# Patient Record
Sex: Female | Born: 1940 | Race: White | Hispanic: No | Marital: Married | Smoking: Never smoker
Health system: Southern US, Community
[De-identification: ages and names within clinical notes are randomized; demographics above are authoritative.]

## PROBLEM LIST (undated history)

## (undated) DIAGNOSIS — R7303 Prediabetes: Secondary | ICD-10-CM

## (undated) DIAGNOSIS — E785 Hyperlipidemia, unspecified: Secondary | ICD-10-CM

## (undated) DIAGNOSIS — Z8489 Family history of other specified conditions: Secondary | ICD-10-CM

## (undated) DIAGNOSIS — I739 Peripheral vascular disease, unspecified: Secondary | ICD-10-CM

## (undated) DIAGNOSIS — M869 Osteomyelitis, unspecified: Secondary | ICD-10-CM

## (undated) HISTORY — PX: BONE RESECTION: SHX897

## (undated) HISTORY — DX: Osteomyelitis, unspecified: M86.9

## (undated) HISTORY — DX: Peripheral vascular disease, unspecified: I73.9

## (undated) HISTORY — DX: Hyperlipidemia, unspecified: E78.5

---

## 1970-04-29 HISTORY — PX: BREAST BIOPSY: SHX20

## 2004-07-16 ENCOUNTER — Ambulatory Visit: Payer: Self-pay | Admitting: Internal Medicine

## 2005-08-15 ENCOUNTER — Ambulatory Visit: Payer: Self-pay | Admitting: Internal Medicine

## 2005-11-18 ENCOUNTER — Ambulatory Visit: Payer: Self-pay | Admitting: Gastroenterology

## 2006-08-25 ENCOUNTER — Ambulatory Visit: Payer: Self-pay

## 2007-08-17 ENCOUNTER — Ambulatory Visit: Payer: Self-pay | Admitting: Family Medicine

## 2008-08-18 ENCOUNTER — Ambulatory Visit: Payer: Self-pay | Admitting: Nurse Practitioner

## 2009-10-26 ENCOUNTER — Ambulatory Visit: Payer: Self-pay | Admitting: Nurse Practitioner

## 2010-09-16 ENCOUNTER — Ambulatory Visit: Payer: Self-pay | Admitting: Internal Medicine

## 2010-11-08 ENCOUNTER — Ambulatory Visit: Payer: Self-pay | Admitting: Family Medicine

## 2010-11-29 ENCOUNTER — Ambulatory Visit: Payer: Self-pay | Admitting: Family Medicine

## 2011-06-27 ENCOUNTER — Ambulatory Visit: Payer: Self-pay | Admitting: Family Medicine

## 2012-12-02 ENCOUNTER — Ambulatory Visit: Payer: Self-pay | Admitting: Family Medicine

## 2014-01-27 ENCOUNTER — Ambulatory Visit: Payer: Self-pay | Admitting: Family Medicine

## 2014-04-09 ENCOUNTER — Ambulatory Visit: Payer: Self-pay | Admitting: Physician Assistant

## 2015-01-16 ENCOUNTER — Other Ambulatory Visit: Payer: Self-pay | Admitting: Family Medicine

## 2015-01-16 DIAGNOSIS — Z1231 Encounter for screening mammogram for malignant neoplasm of breast: Secondary | ICD-10-CM

## 2015-02-01 ENCOUNTER — Ambulatory Visit
Admission: RE | Admit: 2015-02-01 | Discharge: 2015-02-01 | Disposition: A | Discharge status: Home / Self Care | Payer: Medicare Other | Source: Ambulatory Visit | Attending: Family Medicine | Admitting: Family Medicine

## 2015-02-01 DIAGNOSIS — Z1231 Encounter for screening mammogram for malignant neoplasm of breast: Secondary | ICD-10-CM | POA: Diagnosis not present

## 2016-01-08 ENCOUNTER — Other Ambulatory Visit: Payer: Self-pay | Admitting: Family Medicine

## 2016-01-08 DIAGNOSIS — Z1231 Encounter for screening mammogram for malignant neoplasm of breast: Secondary | ICD-10-CM

## 2016-02-08 ENCOUNTER — Ambulatory Visit
Admission: RE | Admit: 2016-02-08 | Discharge: 2016-02-08 | Disposition: A | Discharge status: Home / Self Care | Payer: Medicare Other | Source: Ambulatory Visit | Attending: Family Medicine | Admitting: Family Medicine

## 2016-02-08 DIAGNOSIS — Z1231 Encounter for screening mammogram for malignant neoplasm of breast: Secondary | ICD-10-CM | POA: Insufficient documentation

## 2016-02-29 ENCOUNTER — Encounter (INDEPENDENT_AMBULATORY_CARE_PROVIDER_SITE_OTHER): Payer: Self-pay | Admitting: Vascular Surgery

## 2016-03-07 ENCOUNTER — Ambulatory Visit (INDEPENDENT_AMBULATORY_CARE_PROVIDER_SITE_OTHER): Payer: Medicare Other | Admitting: Vascular Surgery

## 2016-03-07 ENCOUNTER — Encounter (INDEPENDENT_AMBULATORY_CARE_PROVIDER_SITE_OTHER): Payer: Self-pay | Admitting: Vascular Surgery

## 2016-03-07 ENCOUNTER — Encounter (INDEPENDENT_AMBULATORY_CARE_PROVIDER_SITE_OTHER): Payer: Self-pay

## 2016-03-07 DIAGNOSIS — M79609 Pain in unspecified limb: Secondary | ICD-10-CM | POA: Insufficient documentation

## 2016-03-07 DIAGNOSIS — M79604 Pain in right leg: Secondary | ICD-10-CM | POA: Diagnosis not present

## 2016-03-07 DIAGNOSIS — I872 Venous insufficiency (chronic) (peripheral): Secondary | ICD-10-CM

## 2016-03-07 DIAGNOSIS — G4762 Sleep related leg cramps: Secondary | ICD-10-CM | POA: Diagnosis not present

## 2016-03-07 DIAGNOSIS — I83811 Varicose veins of right lower extremities with pain: Secondary | ICD-10-CM

## 2016-03-07 NOTE — Progress Notes (Signed)
Patient in room #2 

## 2016-03-07 NOTE — Progress Notes (Signed)
[facility] Admission History & Physical  MRN : [id number]  Kelly Rodgers is a 75 y.o. (05/21/1940) female who presents with chief complaint of  Chief Complaint  Patient presents with  . New Evaluation    Inflammed varicose veins  .  History of Present Illness:  Recommend:  The patient has large symptomatic varicose veins that are painful and associated with swelling.  I have had a long discussion with the patient regarding  varicose veins and why they cause symptoms.  Patient will begin wearing graduated compression stockings class 1 on a daily basis, beginning first thing in the morning and removing them in the evening. The patient is instructed specifically not to sleep in the stockings.    The patient  will also begin using over-the-counter analgesics such as Motrin 600 mg po TID to help control the symptoms.    In addition, behavioral modification including elevation during the day will be initiated.    Pending the results of these changes the  patient will be reevaluated in three months.   An  ultrasound of the venous system will be obtained.   Further plans will be based on the ultrasound results and whether conservative therapies are successful at eliminating the pain and swelling.   Current Meds  Medication Sig  . zolpidem (AMBIEN) 5 MG tablet Take by mouth.    Past Medical History:  Diagnosis Date  . Hyperlipidemia   . Osteomyelitis (HCC)   . Peripheral vascular disease [facility](HCC)     Past Surgical History:  Procedure Laterality Date  . BONE RESECTION     Reclast infusion  . BREAST BIOPSY Right 1972   neg    Social History Social History  Substance Use Topics  . Smoking status: Never Smoker  . Smokeless tobacco: Never Used  . Alcohol use No    Family History Family History  Problem Relation Age of Onset  . Diabetes Mother   . Heart disease Mother   . Varicose Veins Mother   . Cancer Father   . Heart disease Father   . Heart  disease Sister   . Depression Sister   . Heart disease Brother   . Depression Brother   . Diabetes Maternal Grandmother   . Breast cancer Neg Hx   No family history of bleeding/clotting disorders, porphyria or autoimmune disease   Allergies not on file   REVIEW OF SYSTEMS (Negative unless checked)  Constitutional: [] Weight loss  [] Fever  [] Chills Cardiac: [] Chest pain   [] Chest pressure   [] Palpitations   [] Shortness of breath when laying flat   [] Shortness of breath with exertion. Vascular:  [] Pain in legs with walking   [x] Pain in legs at rest  [] History of DVT   [] Phlebitis   [x] Swelling in legs   [x] Varicose veins   [] Non-healing ulcers Pulmonary:   [] Uses home oxygen   [] Productive cough   [] Hemoptysis   [] Wheeze  [] COPD   [] Asthma Neurologic:  [] Dizziness   [] Seizures   [] History of stroke   [] History of TIA  [] Aphasia   [] Vissual changes   [] Weakness or numbness in arm   [] Weakness or numbness in leg Musculoskeletal:   [] Joint swelling   [x] Joint pain   [] Low back pain Hematologic:  [] Easy bruising  [] Easy bleeding   [] Hypercoagulable state   [] Anemic Gastrointestinal:  [] Diarrhea   [] Vomiting  [] Gastroesophageal reflux/heartburn   [] Difficulty swallowing. Genitourinary:  [] Chronic kidney disease   [] Difficult urination  [] Frequent urination   [] Blood in  urine Skin:  [] Rashes   [] Ulcers  Psychological:  [] History of anxiety   []  History of major depression.  Physical Examination  Vitals:   03/07/16 1329  BP: 128/74  Pulse: 89  Resp: 16  Weight: 113 lb (51.3 kg)  Height: 5\' 4"  (1.626 m)   Body mass index is 19.4 kg/m. Gen: WD/WN, NAD Head: [place]/AT, No temporalis wasting.  Ear/Nose/Throat: Hearing grossly intact, nares w/o erythema or drainage, poor dentition Eyes: PER, EOMI, sclera nonicteric.  Neck: Supple, no masses.  No bruit or JVD.  Pulmonary:  Good air movement, clear to auscultation bilaterally, no use of accessory muscles.  Cardiac: RRR, normal S1, S2, no  Murmurs. Vascular: diffuse varicose veins bilaterally, mild venous changes to the ankles, 2+ edema bilaterally Vessel Right Left  Radial Palpable Palpable  Ulnar Palpable Palpable  Brachial Palpable Palpable  Carotid Palpable Palpable  Femoral Palpable Palpable  Popliteal Palpable Palpable  PT 1+ Palpable 1+Palpable  DP Trace Palpable Trace Palpable   Gastrointestinal: soft, non-distended. No guarding/no peritoneal signs.  Musculoskeletal: M/S 5/5 throughout.  No deformity or atrophy.  Neurologic: CN 2-12 intact. Pain and light touch intact in extremities.  Symmetrical.  Speech is fluent. Motor exam as listed above. Psychiatric: Judgment intact, Mood & affect appropriate for pt's clinical situation. Dermatologic: No rashes or ulcers noted.  No changes consistent with cellulitis. Lymph : No Cervical lymphadenopathy, no lichenification or skin changes of chronic lymphedema.  CBC No results found for: WBC, HGB, HCT, MCV, PLT  BMET No results found for: NA, K, CL, CO2, GLUCOSE, BUN, CREATININE, CALCIUM, GFRNONAA, GFRAA CrCl cannot be calculated (No order found.).  COAG No results found for: INR, PROTIME  Radiology Mm Screening Breast Tomo Bilateral  Result Date: 02/08/2016 CLINICAL DATA:  Screening. EXAM: 2D DIGITAL SCREENING BILATERAL MAMMOGRAM WITH CAD AND ADJUNCT TOMO COMPARISON:  Previous exam(s). ACR Breast Density Category c: The breast tissue is heterogeneously dense, which may obscure small masses. FINDINGS: There are no findings suspicious for malignancy. Images were processed with CAD. IMPRESSION: No mammographic evidence of malignancy. A result letter of this screening mammogram will be mailed directly to the patient. RECOMMENDATION: Screening mammogram in one year. (Code:SM-B-01Y) BI-RADS CATEGORY  1: Negative. Electronically Signed   By: Sherian Rein M.D.   On: 02/08/2016 14:07    Assessment/Plan 1. 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  I have had a long discussion with the patient regarding  varicose veins and why they cause symptoms.  Patient will begin wearing graduated compression stockings class 1 on a daily basis, beginning first thing in the morning and removing them in the evening. The patient is instructed specifically not to sleep in the stockings.    The patient  will also begin using over-the-counter analgesics such as Motrin 600 mg po TID to help control the symptoms.    In addition, behavioral modification including elevation during the day will be initiated.    Pending the results of these changes the  patient will be reevaluated in three months.   An  ultrasound of the venous system will be obtained.   Further plans will be based on the ultrasound results and whether conservative therapies are successful at eliminating the pain and swelling.  - VAS [place] LOWER EXTREMITY VENOUS REFLUX; Future  2. Chronic venous insufficiency  Recommend:  The patient has large symptomatic varicose veins that are painful  and associated with swelling.  I have had a long discussion with the patient regarding  varicose veins and why they cause symptoms.  Patient will begin wearing graduated compression stockings class 1 on a daily basis, beginning first thing in the morning and removing them in the evening. The patient is instructed specifically not to sleep in the stockings.    The patient  will also begin using over-the-counter analgesics such as Motrin 600 mg po TID to help control the symptoms.    In addition, behavioral modification including elevation during the day will be initiated.    Pending the results of these changes the  patient will be reevaluated in three months.   An  ultrasound of the venous system will be obtained.   Further plans will be based on the ultrasound results and whether conservative therapies are successful at  eliminating the pain and swelling.   3. Leg cramps, sleep related Recommend:  The patient is describing Charley horse type leg cramps. No invasive studies, angiography or surgery at this time.    I have reviewed homeopathic remedies such as Cider vinegar or mustard; placing a bar of soap at the bottom of the bed. Quinine is also an option Magnesium supplementation at bedtime was also reviewed.  The patient should continue walking and begin a more formal exercise program.  The patient should continue antiplatelet therapy and aggressive treatment of the lipid abnormalities  The patient should continue wearing graduated compression socks 10-15 mmHg strength to control any mild edema.  The patient will follow up with me on a PRN basis.   4. Pain of right lower extremity See # 1 and # 3    Levora DredgeGregory Meiah Zamudio, MD  03/07/2016 2:25 PM

## 2016-06-06 ENCOUNTER — Ambulatory Visit (INDEPENDENT_AMBULATORY_CARE_PROVIDER_SITE_OTHER): Payer: Medicare Other | Admitting: Vascular Surgery

## 2016-06-06 ENCOUNTER — Encounter (INDEPENDENT_AMBULATORY_CARE_PROVIDER_SITE_OTHER): Payer: Medicare Other

## 2016-06-28 ENCOUNTER — Ambulatory Visit
Admission: RE | Admit: 2016-06-28 | Payer: Medicare Other | Source: Ambulatory Visit | Admitting: Unknown Physician Specialty

## 2016-06-28 ENCOUNTER — Encounter: Admission: RE | Payer: Self-pay | Source: Ambulatory Visit

## 2016-06-28 SURGERY — COLONOSCOPY WITH PROPOFOL
Anesthesia: General

## 2016-06-29 ENCOUNTER — Ambulatory Visit
Admission: EM | Admit: 2016-06-29 | Discharge: 2016-06-29 | Disposition: A | Discharge status: Home / Self Care | Payer: Medicare Other | Attending: Family Medicine | Admitting: Family Medicine

## 2016-06-29 DIAGNOSIS — E785 Hyperlipidemia, unspecified: Secondary | ICD-10-CM | POA: Insufficient documentation

## 2016-06-29 DIAGNOSIS — R350 Frequency of micturition: Secondary | ICD-10-CM

## 2016-06-29 DIAGNOSIS — Z79899 Other long term (current) drug therapy: Secondary | ICD-10-CM | POA: Diagnosis not present

## 2016-06-29 DIAGNOSIS — R3915 Urgency of urination: Secondary | ICD-10-CM | POA: Insufficient documentation

## 2016-06-29 DIAGNOSIS — R300 Dysuria: Secondary | ICD-10-CM | POA: Diagnosis present

## 2016-06-29 LAB — URINALYSIS, COMPLETE (UACMP) WITH MICROSCOPIC
BACTERIA UA: NONE SEEN
RBC / HPF: NONE SEEN RBC/hpf (ref 0–5)
Squamous Epithelial / LPF: NONE SEEN

## 2016-06-29 MED ORDER — CIPROFLOXACIN HCL 500 MG PO TABS: 500.0000 mg | ORAL_TABLET | Freq: Two times a day (BID) | ORAL | 0 refills | Status: AC

## 2016-06-29 NOTE — Discharge Instructions (Signed)
Recommend start Cipro 500mg  twice a day as directed. May continue AZO as needed. Continue to increase fluids. Follow-up pending urine culture results and with your Urologist in 3 to 4 days if not improving.

## 2016-06-29 NOTE — ED Provider Notes (Signed)
CSN: [id number]     Arrival date & time 06/29/16  1004 History   First MD Initiated Contact with Patient 06/29/16 1042     Chief Complaint  Patient presents with  . Recurrent UTI   (Consider location/radiation/quality/duration/timing/severity/associated sxs/prior Treatment) 76 year old female presents with burning with urination, frequency, urgency and foul odor to her urine for the past 2 days. Denies any fever, abdominal pain, nausea, vomiting or flank pain. She took AZO today with some relief. Has history of recurrent UTI's. Last one about 4 weeks ago and took Macrobid with some success. Previously on Cipro with good success. Has not seen Urologist a few years but plans to return for recheck within the next month.    The history is provided by the patient.    Past Medical History:  Diagnosis Date  . Hyperlipidemia   . Osteomyelitis (HCC)   . Peripheral vascular disease [facility](HCC)    Past Surgical History:  Procedure Laterality Date  . BONE RESECTION     Reclast infusion  . BREAST BIOPSY Right 1972   neg   Family History  Problem Relation Age of Onset  . Diabetes Mother   . Heart disease Mother   . Varicose Veins Mother   . Cancer Father   . Heart disease Father   . Heart disease Sister   . Depression Sister   . Heart disease Brother   . Depression Brother   . Diabetes Maternal Grandmother   . Breast cancer Neg Hx    Social History  Substance Use Topics  . Smoking status: Never Smoker  . Smokeless tobacco: Never Used  . Alcohol use No   OB History    No data available     Review of Systems  Constitutional: Negative for chills, fatigue and fever.  Gastrointestinal: Negative for abdominal pain, diarrhea, nausea and vomiting.  Genitourinary: Positive for decreased urine volume, dysuria, frequency and urgency. Negative for hematuria and pelvic pain.  Musculoskeletal: Positive for back pain.  Skin: Negative for rash and wound.  Neurological: Negative for dizziness,  syncope, weakness and headaches.  Hematological: Negative for adenopathy.    Allergies  Patient has no known allergies.  Home Medications   Prior to Admission medications   Medication Sig Start Date End Date Taking? Authorizing Provider  Calcium Carbonate-Vitamin D (CALCIUM-VITAMIN D) 500-200 MG-UNIT tablet Take by mouth.   Yes Historical Provider, MD  clonazePAM (KLONOPIN) 0.5 MG tablet  02/01/16  Yes Historical Provider, MD  gabapentin (NEURONTIN) 600 MG tablet Take 600 mg by mouth at bedtime as needed.   Yes Historical Provider, MD  phenazopyridine (PYRIDIUM) 95 MG tablet Take 95 mg by mouth daily. Pt used 2 of 95 mg tablet this morning   Yes Historical Provider, MD  zolpidem (AMBIEN) 5 MG tablet Take by mouth. 07/21/15 07/21/16 Yes Historical Provider, MD  ciprofloxacin (CIPRO) 500 MG tablet Take 1 tablet (500 mg total) by mouth 2 (two) times daily. 06/29/16 07/06/16  Sudie GrumblingAnn Berry Kenroy Timberman, NP   Meds Ordered and Administered this Visit  Medications - No data to display  BP (!) 133/56 (BP Location: Left Arm)   Pulse 74   Temp 97.8 F (36.6 C) (Oral)   Resp 16   Ht 5' 4.5" (1.638 m)   Wt 112 lb (50.8 kg)   SpO2 100%   BMI 18.93 kg/m  No data found.   Physical Exam  Constitutional: She is oriented to person, place, and time. She appears well-developed and well-nourished. No distress.  Cardiovascular: Normal rate and regular rhythm.   Abdominal: Soft. Bowel sounds are normal. There is no hepatosplenomegaly. There is no tenderness. There is no rigidity, no rebound, no guarding and no CVA tenderness.  Musculoskeletal: Normal range of motion.  Neurological: She is alert and oriented to person, place, and time.  Skin: Skin is warm and dry. Capillary refill takes less than 2 seconds.  Psychiatric: She has a normal mood and affect. Her behavior is normal. Judgment and thought content normal.    Urgent Care Course     Procedures (including critical care time)  Labs Review Labs Reviewed   URINALYSIS, COMPLETE (UACMP) WITH MICROSCOPIC - Abnormal; Notable for the following:       Result Value   Color, Urine ORANGE (*)    APPearance HAZY (*)    Glucose, UA   (*)    Value: TEST NOT REPORTED DUE TO COLOR INTERFERENCE OF URINE PIGMENT   Hgb urine dipstick   (*)    Value: TEST NOT REPORTED DUE TO COLOR INTERFERENCE OF URINE PIGMENT   Bilirubin Urine   (*)    Value: TEST NOT REPORTED DUE TO COLOR INTERFERENCE OF URINE PIGMENT   Ketones, ur   (*)    Value: TEST NOT REPORTED DUE TO COLOR INTERFERENCE OF URINE PIGMENT   Protein, ur   (*)    Value: TEST NOT REPORTED DUE TO COLOR INTERFERENCE OF URINE PIGMENT   Nitrite   (*)    Value: TEST NOT REPORTED DUE TO COLOR INTERFERENCE OF URINE PIGMENT   Leukocytes, UA   (*)    Value: TEST NOT REPORTED DUE TO COLOR INTERFERENCE OF URINE PIGMENT   All other components within normal limits  URINE CULTURE    Imaging Review No results found.   Visual Acuity Review  Right Eye Distance:   Left Eye Distance:   Bilateral Distance:    Right Eye Near:   Left Eye Near:    Bilateral Near:         MDM   1. Dysuria   2. Urinary frequency    Discussed that AZO interfered with interpretation of urinalysis results. Will treat for a UTI and send urine culture today. Since patient has had good success with Cipro in the past, will treat with Cipro 500mg  twice a day as directed. May continue AZO as needed. Continue to increase fluids to help with symptoms. Follow-up pending urine culture results and will her Urologist in [place] in 3 to 4 days if not improving.     Sudie Grumbling, NP 06/29/16 1501

## 2016-06-29 NOTE — ED Triage Notes (Signed)
As per patient burning foul smell frequent urination onset 2 days

## 2016-07-01 LAB — URINE CULTURE

## 2017-01-03 ENCOUNTER — Encounter: Admission: RE | Payer: Self-pay | Source: Ambulatory Visit

## 2017-01-03 ENCOUNTER — Ambulatory Visit
Admission: RE | Admit: 2017-01-03 | Payer: Medicare Other | Source: Ambulatory Visit | Admitting: Unknown Physician Specialty

## 2017-01-03 SURGERY — COLONOSCOPY WITH PROPOFOL
Anesthesia: General

## 2017-01-31 ENCOUNTER — Other Ambulatory Visit: Payer: Self-pay | Admitting: Family Medicine

## 2017-01-31 DIAGNOSIS — Z1231 Encounter for screening mammogram for malignant neoplasm of breast: Secondary | ICD-10-CM

## 2017-02-26 ENCOUNTER — Ambulatory Visit
Admission: RE | Admit: 2017-02-26 | Discharge: 2017-02-26 | Disposition: A | Discharge status: Home / Self Care | Payer: Medicare Other | Source: Ambulatory Visit | Attending: Family Medicine | Admitting: Family Medicine

## 2017-02-26 DIAGNOSIS — Z1231 Encounter for screening mammogram for malignant neoplasm of breast: Secondary | ICD-10-CM | POA: Insufficient documentation

## 2018-04-03 ENCOUNTER — Other Ambulatory Visit: Payer: Self-pay | Admitting: Family Medicine

## 2018-04-03 DIAGNOSIS — Z1231 Encounter for screening mammogram for malignant neoplasm of breast: Secondary | ICD-10-CM

## 2018-04-08 ENCOUNTER — Ambulatory Visit
Admission: RE | Admit: 2018-04-08 | Discharge: 2018-04-08 | Disposition: A | Discharge status: Home / Self Care | Payer: Medicare Other | Source: Ambulatory Visit | Attending: Family Medicine | Admitting: Family Medicine

## 2018-04-08 ENCOUNTER — Encounter (INDEPENDENT_AMBULATORY_CARE_PROVIDER_SITE_OTHER): Payer: Self-pay

## 2018-04-08 DIAGNOSIS — Z1231 Encounter for screening mammogram for malignant neoplasm of breast: Secondary | ICD-10-CM | POA: Insufficient documentation

## 2018-06-29 ENCOUNTER — Other Ambulatory Visit: Payer: Self-pay

## 2018-06-29 ENCOUNTER — Ambulatory Visit
Admission: EM | Admit: 2018-06-29 | Discharge: 2018-06-29 | Disposition: A | Discharge status: Home / Self Care | Payer: Medicare Other | Attending: Family Medicine | Admitting: Family Medicine

## 2018-06-29 ENCOUNTER — Encounter: Payer: Self-pay | Admitting: Emergency Medicine

## 2018-06-29 DIAGNOSIS — K644 Residual hemorrhoidal skin tags: Secondary | ICD-10-CM

## 2018-06-29 MED ORDER — HYDROCORTISONE 2.5 % RE CREA: 1.0000 "application " | TOPICAL_CREAM | Freq: Two times a day (BID) | RECTAL | 0 refills | Status: DC

## 2018-06-29 MED ORDER — POLYETHYLENE GLYCOL 3350 17 GM/SCOOP PO POWD: ORAL | 0 refills | Status: DC

## 2018-06-29 MED ORDER — HYDROCORTISONE ACETATE 25 MG RE SUPP: 25.0000 mg | Freq: Two times a day (BID) | RECTAL | 0 refills | Status: DC

## 2018-06-29 NOTE — Discharge Instructions (Signed)
Medications as prescribed. ° °Take care ° °Dr. Keymani Glynn  °

## 2018-06-29 NOTE — ED Triage Notes (Signed)
Pt c/o anal pain and bleeding. She has h/o hemorrhoids. Started about 4 days ago. Pain is worse with BM. Pt states that she was constipated prior to this starting.

## 2018-06-29 NOTE — ED Provider Notes (Signed)
[facility]    CSN: [id number] Arrival date & time: 06/29/18  1249  History   Chief Complaint Chief Complaint  Patient presents with  . Rectal Pain   HPI  78 year old female presents with rectal pain secondary to hemorrhoids.  Patient reports that she has had ongoing constipation.  She states that she has been bothered by external hemorrhoids for the past 4 days.  She reports rectal bleeding as well as severe pain.  Worse with bowel movements.  She does have pain when she is not using restroom.  Has a history of external hemorrhoids.  Has never had surgery.  No relieving factors. No other associated symptoms. No other complaints.   PMH, Surgical Hx, Family Hx, Social History reviewed and updated as below.  PMH: Osteoporosis    Vitamin D Deficiency    Hearing Loss    Hyperlipidemia    Post menopausal syndrome    Insomnia 06/30/2013   Hyperglycemia 12/24/2013   HLD (hyperlipidemia) 12/24/2013   Hyperglycemia    Migraines    History of shingles 08/17/2015 Postherpetic neuralgia requiring Neurontin use   Surgical Hx: BREAST CYST EXCISION   right   SIGMOIDOSCOPY      COLONOSCOPY 11/18/2005  Ext Hemorrhoids: CBF 10/2015   COLONOSCOPY   [facility]CC (Daughter), FH Colon Polyps (Daughter/Son)     OB History   No obstetric history on file.      Home Medications    Prior to Admission medications   Medication Sig Start Date End Date Taking? Authorizing Provider  Calcium Carbonate-Vitamin D (CALCIUM-VITAMIN D) 500-200 MG-UNIT tablet Take by mouth.   Yes [provider]  gabapentin (NEURONTIN) 600 MG tablet Take 600 mg by mouth at bedtime as needed.   Yes [provider]  clonazePAM (KLONOPIN) 0.5 MG tablet  02/01/16   [provider]  hydrocortisone (ANUSOL-HC) 2.5 % rectal cream Place 1 application rectally 2 (two) times daily. 06/29/18   Tommie Sams, DO  hydrocortisone (ANUSOL-HC) 25 MG suppository Place 1 suppository (25  mg total) rectally 2 (two) times daily. 06/29/18   Tommie Sams, DO  polyethylene glycol powder (GLYCOLAX/MIRALAX) powder 17 g once or twice daily for constipation. 06/29/18   Tommie Sams, DO    Family History Family History  Problem Relation Age of Onset  . Diabetes Mother   . Heart disease Mother   . Varicose Veins Mother   . Cancer Father   . Heart disease Father   . Heart disease Sister   . Depression Sister   . Heart disease Brother   . Depression Brother   . Diabetes Maternal Grandmother   . Breast cancer Neg Hx     Social History Social History   Tobacco Use  . Smoking status: Never Smoker  . Smokeless tobacco: Never Used  Substance Use Topics  . Alcohol use: No  . Drug use: No     Allergies   Patient has no known allergies.   Review of Systems Review of Systems  Constitutional: Negative.   Gastrointestinal: Positive for anal bleeding, constipation and rectal pain.   Physical Exam Triage Vital Signs ED Triage Vitals  Enc Vitals Group     BP 06/29/18 1337 (!) 142/72     Pulse Rate 06/29/18 1337 99     Resp 06/29/18 1337 18     Temp 06/29/18 1337 98 F (36.7 C)     Temp Source 06/29/18 1337 Oral     SpO2 06/29/18 1337 99 %  Weight 06/29/18 1333 112 lb (50.8 kg)     Height 06/29/18 1333 5\' 5"  (1.651 m)     Head Circumference --      Peak Flow --      Pain Score 06/29/18 1333 8     Pain Loc --      Pain Edu? --      Excl. in GC? --    Updated Vital Signs BP (!) 142/72 (BP Location: Left Arm)   Pulse 99   Temp 98 F (36.7 C) (Oral)   Resp 18   Ht 5\' 5"  (1.651 m)   Wt 50.8 kg   SpO2 99%   BMI 18.64 kg/m   Visual Acuity Right Eye Distance:   Left Eye Distance:   Bilateral Distance:    Right Eye Near:   Left Eye Near:    Bilateral Near:     Physical Exam Vitals signs and nursing note reviewed.  Constitutional:      General: She is not in acute distress.    Appearance: Normal appearance.  HENT:     Head: Normocephalic and  atraumatic.     Nose: Nose normal.  Eyes:     General:        Right eye: No discharge.        Left eye: No discharge.     Conjunctiva/sclera: Conjunctivae normal.  Pulmonary:     Effort: Pulmonary effort is normal. No respiratory distress.  Genitourinary:    Comments: Multiple external hemorrhoids noted.  No apparent thrombosis. Neurological:     Mental Status: She is alert.  Psychiatric:        Mood and Affect: Mood normal.        Behavior: Behavior normal.    UC Treatments / Results  Labs (all labs ordered are listed, but only abnormal results are displayed) Labs Reviewed - No data to display  EKG None  Radiology No results found.  Procedures Procedures (including critical care time)  Medications Ordered in UC Medications - No data to display  Initial Impression / Assessment and Plan / UC Course  I have reviewed the triage vital signs and the nursing notes.  Pertinent labs & imaging results that were available during my care of the patient were reviewed by me and considered in my medical decision making (see chart for details).    78 year old female presents with external hemorrhoids.  Treating with Anusol cream as well as suppository.  MiraLAX for constipation.  Final Clinical Impressions(s) / UC Diagnoses   Final diagnoses:  External hemorrhoids     Discharge Instructions     Medications as prescribed.  Take care  Dr. Adriana Simas    ED Prescriptions    Medication Sig Dispense Auth. Provider   hydrocortisone (ANUSOL-HC) 2.5 % rectal cream Place 1 application rectally 2 (two) times daily. 30 g Taejon Irani G, DO   polyethylene glycol powder (GLYCOLAX/MIRALAX) powder 17 g once or twice daily for constipation. 500 g Amesha Bailey G, DO   hydrocortisone (ANUSOL-HC) 25 MG suppository Place 1 suppository (25 mg total) rectally 2 (two) times daily. 24 suppository Tommie Sams, DO     Controlled Substance Prescriptions [place] Controlled Substance Registry consulted? Not  Applicable   Tommie Sams, DO 06/29/18 1439

## 2019-03-29 ENCOUNTER — Other Ambulatory Visit: Payer: Self-pay | Admitting: Family Medicine

## 2019-03-29 DIAGNOSIS — Z1231 Encounter for screening mammogram for malignant neoplasm of breast: Secondary | ICD-10-CM

## 2019-04-12 ENCOUNTER — Other Ambulatory Visit: Payer: Self-pay

## 2019-04-12 ENCOUNTER — Ambulatory Visit
Admission: RE | Admit: 2019-04-12 | Discharge: 2019-04-12 | Disposition: A | Discharge status: Home / Self Care | Payer: Medicare Other | Source: Ambulatory Visit | Attending: Family Medicine | Admitting: Family Medicine

## 2019-04-12 DIAGNOSIS — Z1231 Encounter for screening mammogram for malignant neoplasm of breast: Secondary | ICD-10-CM | POA: Diagnosis not present

## 2020-04-05 ENCOUNTER — Other Ambulatory Visit: Payer: Self-pay | Admitting: Family Medicine

## 2020-04-05 DIAGNOSIS — Z1231 Encounter for screening mammogram for malignant neoplasm of breast: Secondary | ICD-10-CM

## 2020-04-26 ENCOUNTER — Ambulatory Visit
Admission: RE | Admit: 2020-04-26 | Discharge: 2020-04-26 | Disposition: A | Discharge status: Home / Self Care | Payer: Medicare Other | Source: Ambulatory Visit | Attending: Family Medicine | Admitting: Family Medicine

## 2020-04-26 ENCOUNTER — Other Ambulatory Visit: Payer: Self-pay

## 2020-04-26 DIAGNOSIS — Z1231 Encounter for screening mammogram for malignant neoplasm of breast: Secondary | ICD-10-CM | POA: Insufficient documentation

## 2021-04-13 ENCOUNTER — Other Ambulatory Visit: Payer: Self-pay | Admitting: Family Medicine

## 2021-04-13 DIAGNOSIS — Z1231 Encounter for screening mammogram for malignant neoplasm of breast: Secondary | ICD-10-CM

## 2021-04-15 ENCOUNTER — Ambulatory Visit
Admission: EM | Admit: 2021-04-15 | Discharge: 2021-04-15 | Disposition: A | Discharge status: Home / Self Care | Payer: Medicare Other

## 2021-04-15 ENCOUNTER — Other Ambulatory Visit: Payer: Self-pay

## 2021-04-15 DIAGNOSIS — J111 Influenza due to unidentified influenza virus with other respiratory manifestations: Secondary | ICD-10-CM

## 2021-04-15 MED ORDER — IPRATROPIUM BROMIDE 0.06 % NA SOLN: 2.0000 | Freq: Four times a day (QID) | NASAL | 12 refills | Status: DC

## 2021-04-15 MED ORDER — OSELTAMIVIR PHOSPHATE 75 MG PO CAPS: 75.0000 mg | ORAL_CAPSULE | Freq: Two times a day (BID) | ORAL | 0 refills | Status: DC

## 2021-04-15 MED ORDER — PSEUDOEPH-BROMPHEN-DM 30-2-10 MG/5ML PO SYRP: 5.0000 mL | ORAL_SOLUTION | Freq: Four times a day (QID) | ORAL | 0 refills | Status: DC | PRN

## 2021-04-15 MED ORDER — BENZONATATE 100 MG PO CAPS: 200.0000 mg | ORAL_CAPSULE | Freq: Three times a day (TID) | ORAL | 0 refills | Status: DC

## 2021-04-15 NOTE — ED Triage Notes (Signed)
Patient is here today with "started with a Cough, then congestion, fever, symptoms started yesterday". Husband has been sick for "3 wks'. COVID vaccines "done" without last booster. Flu vaccine done. No sob.

## 2021-04-15 NOTE — Discharge Instructions (Signed)
Take the Tamiflu twice daily for 5 days for treatment of influenza.  Use the Atrovent nasal spray, 2 squirts up each nostril every 6 hours, as needed for nasal congestion and runny nose.  Use over-the-counter Delsym, Zarbee's, or Robitussin during the day as needed for cough.  Use the Tessalon Perles every 8 hours as needed for cough.  Taken with a small sip of water.  You may experience some numbness to your tongue or metallic taste in her mouth, this is normal.  Use the Bromfed DM cough syrup at bedtime as will make you drowsy but it should help dry up your postnasal drip and aid you in sleep and cough relief.  Return for reevaluation, or see your primary care provider, for new or worsening symptoms.

## 2021-04-15 NOTE — ED Provider Notes (Signed)
[facility]    CSN: [id number] Arrival date & time: 04/15/21  0805      History   Chief Complaint Chief Complaint  Patient presents with   Fever   Cough   Chills    HPI Kelly Rodgers is a 80 y.o. female.   HPI  80 year old female here for evaluation of flulike symptoms.  Patient ports that she has been experiencing fever with a T-max of 102, nasal congestion with yellowish nasal discharge, sinus pressure, sore throat, nonproductive cough, body aches, and nausea that started 2 days ago.  She denies headache, shortness breath or wheezing, vomiting, or diarrhea.  Her husband has had similar symptoms for the last 3 weeks.  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      Home Medications    Prior to Admission medications   Medication Sig Start Date End Date Taking? Authorizing Provider  benzonatate (TESSALON) 100 MG capsule Take 2 capsules (200 mg total) by mouth every 8 (eight) hours. 04/15/21  Yes Becky Augusta, NP  brompheniramine-pseudoephedrine-DM 30-2-10 MG/5ML syrup Take 5 mLs by mouth 4 (four) times daily as needed. 04/15/21  Yes Becky Augusta, NP  Calcium Carbonate-Vitamin D (CALCIUM-VITAMIN D) 500-200 MG-UNIT tablet Take by mouth.   Yes [provider]  clonazePAM (KLONOPIN) 0.5 MG tablet  02/01/16  Yes [provider]  ipratropium (ATROVENT) 0.06 % nasal spray Place 2 sprays into both nostrils 4 (four) times daily. 04/15/21  Yes Becky Augusta, NP   oseltamivir (TAMIFLU) 75 MG capsule Take 1 capsule (75 mg total) by mouth every 12 (twelve) hours. 04/15/21  Yes Becky Augusta, NP  rosuvastatin (CRESTOR) 5 MG tablet Take 5 mg by mouth once a week. 04/11/21  Yes [provider]  gabapentin (NEURONTIN) 600 MG tablet Take 600 mg by mouth at bedtime as needed.    [provider]  hydrocortisone (ANUSOL-HC) 2.5 % rectal cream Place 1 application rectally 2 (two) times daily. 06/29/18   Tommie Sams, DO  hydrocortisone (ANUSOL-HC) 25 MG suppository Place 1 suppository (25 mg total) rectally 2 (two) times daily. 06/29/18   Tommie Sams, DO  polyethylene glycol powder (GLYCOLAX/MIRALAX) powder 17 g once or twice daily for constipation. 06/29/18   Tommie Sams, DO    Family History Family History  Problem Relation Age of Onset   Diabetes Mother    Heart disease Mother    Varicose Veins Mother    Cancer Father    Heart disease Father    Heart disease Sister    Depression Sister    Heart disease Brother    Depression Brother    Diabetes Maternal Grandmother    Breast cancer Neg Hx     Social History Social History   Tobacco Use   Smoking status: Never   Smokeless tobacco: Never  Vaping Use   Vaping Use: Never used  Substance Use Topics   Alcohol use:  No   Drug use: No     Allergies   Patient has no known allergies.   Review of Systems Review of Systems  Constitutional:  Positive for fatigue and fever. Negative for activity change and appetite change.  HENT:  Positive for congestion, rhinorrhea and sore throat. Negative for ear pain.   Respiratory:  Positive for cough. Negative for shortness of breath and wheezing.   Gastrointestinal:  Positive for nausea. Negative for diarrhea and vomiting.  Musculoskeletal:  Positive for arthralgias and myalgias.  Skin:  Negative for rash.  Neurological:  Negative for headaches.  Hematological: Negative.   Psychiatric/Behavioral: Negative.      Physical Exam Triage  Vital Signs ED Triage Vitals  Enc Vitals Group     BP 04/15/21 0819 125/66     Pulse Rate 04/15/21 0819 (!) 108     Resp 04/15/21 0819 20     Temp 04/15/21 0819 98.3 F (36.8 C)     Temp Source 04/15/21 0819 Oral     SpO2 04/15/21 0819 98 %     Weight 04/15/21 0816 112 lb (50.8 kg)     Height --      Head Circumference --      Peak Flow --      Pain Score 04/15/21 0816 0     Pain Loc --      Pain Edu? --      Excl. in GC? --    No data found.  Updated Vital Signs BP 125/66 (BP Location: Left Arm)    Pulse (!) 108    Temp 98.3 F (36.8 C) (Oral)    Resp 20    Wt 112 lb (50.8 kg)    SpO2 98%    BMI 18.64 kg/m   Visual Acuity Right Eye Distance:   Left Eye Distance:   Bilateral Distance:    Right Eye Near:   Left Eye Near:    Bilateral Near:     Physical Exam Vitals and nursing note reviewed.  Constitutional:      Appearance: Normal appearance. She is ill-appearing.  HENT:     Head: Normocephalic and atraumatic.     Right Ear: Tympanic membrane, ear canal and external ear normal. There is no impacted cerumen.     Left Ear: Tympanic membrane, ear canal and external ear normal. There is no impacted cerumen.     Nose: Congestion and rhinorrhea present.     Mouth/Throat:     Mouth: Mucous membranes are moist.     Pharynx: Oropharynx is clear. Posterior oropharyngeal erythema present.  Cardiovascular:     Rate and Rhythm: Normal rate and regular rhythm.     Pulses: Normal pulses.     Heart sounds: Normal heart sounds. No murmur heard.   No friction rub. No gallop.  Pulmonary:     Effort: Pulmonary effort is normal.     Breath sounds: Normal breath sounds. No wheezing, rhonchi or rales.  Musculoskeletal:     Cervical back: Normal range of motion and neck supple.  Lymphadenopathy:     Cervical: No cervical adenopathy.  Skin:    General: Skin is warm and dry.     Capillary Refill: Capillary refill takes less than 2 seconds.     Findings: No erythema or rash.   Neurological:     General: No focal deficit present.     Mental Status: She is alert and oriented to person, place, and time.  Psychiatric:  Mood and Affect: Mood normal.        Behavior: Behavior normal.        Thought Content: Thought content normal.        Judgment: Judgment normal.     UC Treatments / Results  Labs (all labs ordered are listed, but only abnormal results are displayed) Labs Reviewed - No data to display  EKG   Radiology No results found.  Procedures Procedures (including critical care time)  Medications Ordered in UC Medications - No data to display  Initial Impression / Assessment and Plan / UC Course  I have reviewed the triage vital signs and the nursing notes.  Pertinent labs & imaging results that were available during my care of the patient were reviewed by me and considered in my medical decision making (see chart for details).  Patient is a pleasant though ill-appearing 80 year old female here for evaluation of influenza-like symptoms that been present since the evening before last.  She has had a fever with a T-max of 102 and is expressing nasal congestion with yellowish nasal discharge, sinus pressure, sore throat, body aches, nonproductive cough, and nausea.  Her husband has had similar symptoms for the last 3 weeks.  On physical exam patient has pearly-gray tympanic membranes bilaterally with normal light reflex and clear external auditory canals.  Nasal mucosa is erythematous and edematous with clear nasal discharge in both nares.  Oropharyngeal exam reveals posterior oropharyngeal erythema and injection with clear postnasal drip.  No cervical lymphadenopathy appreciable exam.  Cardiopulmonary exam reveals clear lung sounds all fields.  Patient does have a harsh cough but it is nonproductive.  Patient exam is consistent with a viral respiratory illness, most likely influenza.  I will treat her with Tamiflu twice daily for influenza, Atrovent  nasal rate up the nasal congestion, Tessalon Perles to help with cough during the daytime, and Bromfed-DM cough syrup for use at bedtime.   Final Clinical Impressions(s) / UC Diagnoses   Final diagnoses:  Influenza-like illness     Discharge Instructions      Take the Tamiflu twice daily for 5 days for treatment of influenza.  Use the Atrovent nasal spray, 2 squirts up each nostril every 6 hours, as needed for nasal congestion and runny nose.  Use over-the-counter Delsym, Zarbee's, or Robitussin during the day as needed for cough.  Use the Tessalon Perles every 8 hours as needed for cough.  Taken with a small sip of water.  You may experience some numbness to your tongue or metallic taste in her mouth, this is normal.  Use the Bromfed DM cough syrup at bedtime as will make you drowsy but it should help dry up your postnasal drip and aid you in sleep and cough relief.  Return for reevaluation, or see your primary care provider, for new or worsening symptoms.      ED Prescriptions     Medication Sig Dispense Auth. Provider   oseltamivir (TAMIFLU) 75 MG capsule Take 1 capsule (75 mg total) by mouth every 12 (twelve) hours. 10 capsule Becky Augusta, NP   benzonatate (TESSALON) 100 MG capsule Take 2 capsules (200 mg total) by mouth every 8 (eight) hours. 21 capsule Becky Augusta, NP   ipratropium (ATROVENT) 0.06 % nasal spray Place 2 sprays into both nostrils 4 (four) times daily. 15 mL Becky Augusta, NP   brompheniramine-pseudoephedrine-DM 30-2-10 MG/5ML syrup Take 5 mLs by mouth 4 (four) times daily as needed. 120 mL Becky Augusta, NP  PDMP not reviewed this encounter.   Becky Augusta, NP 04/15/21 [phone]

## 2021-05-01 ENCOUNTER — Ambulatory Visit
Admission: RE | Admit: 2021-05-01 | Discharge: 2021-05-01 | Disposition: A | Discharge status: Home / Self Care | Payer: Medicare Other | Source: Ambulatory Visit | Attending: Family Medicine | Admitting: Family Medicine

## 2021-05-01 ENCOUNTER — Other Ambulatory Visit: Payer: Self-pay

## 2021-05-01 DIAGNOSIS — Z1231 Encounter for screening mammogram for malignant neoplasm of breast: Secondary | ICD-10-CM | POA: Diagnosis present

## 2021-11-18 ENCOUNTER — Encounter: Payer: Self-pay | Admitting: Emergency Medicine

## 2021-11-18 ENCOUNTER — Ambulatory Visit
Admission: EM | Admit: 2021-11-18 | Discharge: 2021-11-18 | Disposition: A | Discharge status: Home / Self Care | Payer: Medicare Other | Attending: Family | Admitting: Family

## 2021-11-18 DIAGNOSIS — T63421A Toxic effect of venom of ants, accidental (unintentional), initial encounter: Secondary | ICD-10-CM

## 2021-11-18 MED ORDER — PREDNISONE 10 MG (21) PO TBPK
ORAL_TABLET | Freq: Every day | ORAL | 0 refills | Status: DC
Start: 2021-11-18 — End: 2022-09-05

## 2021-11-18 NOTE — Discharge Instructions (Addendum)
Recommend start oral Prednisone 10mg  tablets- take 6 tablets today with food then decrease by 1 tablet each day until finished. May use OTC Caladryl lotion and apply on bite areas. Apply cool compresses to area for comfort. May also take oral Benadryl 25mg  every 6 hours as needed for itching. Follow-up in 3 to 4 days if not improving and resolving or sooner if worsening.

## 2021-11-18 NOTE — ED Provider Notes (Signed)
[facility]    CSN: [id number] Arrival date & time: 11/18/21  1159      History   Chief Complaint Chief Complaint  Patient presents with   Insect Bite    HPI Kelly Rodgers is a 81 y.o. female.   81 year old female presents with multiple fire ant bites. She accidentally stepped into a fire ant hill in the yard yesterday. She was bit multiple times on her legs, arms, hands and abdomen. No bites on her face or neck. Areas are red and itchy. A few spots with some white to yellowish centers. She has applied Triamcinolone cream to the areas and applied Benadryl spray and taken oral Benadryl with minimal relief. She denies any fever, difficulty swallowing or breathing. Other chronic health issues include hyperlipidemia, peripheral vascular disease and mild anxiety. Currently on Crestor, Gabapentin, and Klonopin prn.   The history is provided by the patient.    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      Home Medications    Prior to Admission medications   Medication Sig Start Date End Date Taking? Authorizing Provider  Calcium Carbonate-Vitamin D (CALCIUM-VITAMIN D) 500-200 MG-UNIT tablet Take by mouth.   Yes [provider]  clonazePAM (KLONOPIN) 0.5 MG tablet  02/01/16  Yes [provider]  gabapentin (NEURONTIN) 600 MG tablet Take 600 mg by mouth at bedtime as needed.   Yes [provider]  predniSONE (STERAPRED UNI-PAK 21 TAB) 10 MG (21) TBPK tablet Take by  mouth daily. Take 6 tabs by mouth 1st day then decrease by 1 tablet each day until finished. 11/18/21  Yes Maxyne Derocher, Ali Lowe, NP  rosuvastatin (CRESTOR) 5 MG tablet Take 5 mg by mouth once a week. 04/11/21  Yes [provider]  hydrocortisone (ANUSOL-HC) 25 MG suppository Place 1 suppository (25 mg total) rectally 2 (two) times daily. 06/29/18   Tommie Sams, DO    Family History Family History  Problem Relation Age of Onset   Diabetes Mother    Heart disease Mother    Varicose Veins Mother    Cancer Father    Heart disease Father    Heart disease Sister    Depression Sister    Heart disease Brother    Depression Brother    Diabetes Maternal Grandmother    Breast cancer Neg Hx     Social History Social History   Tobacco Use   Smoking status: Never   Smokeless tobacco: Never  Vaping Use   Vaping Use: Never used  Substance Use Topics   Alcohol use: No   Drug use: No     Allergies   Patient has no known allergies.   Review of Systems Review of Systems  Constitutional:  Negative for activity change, appetite change, chills, diaphoresis and fever.  HENT:  Negative for facial swelling and trouble swallowing.   Respiratory:  Negative for chest tightness, shortness of  breath and wheezing.   Musculoskeletal:  Negative for arthralgias and myalgias.  Skin:  Positive for color change and wound.  Allergic/Immunologic: Negative for environmental allergies, food allergies and immunocompromised state.  Neurological:  Negative for dizziness, tremors, seizures, syncope, light-headedness, numbness and headaches.  Hematological:  Negative for adenopathy. Does not bruise/bleed easily.  Psychiatric/Behavioral:  Positive for sleep disturbance.      Physical Exam Triage Vital Signs ED Triage Vitals  Enc Vitals Group     BP 11/18/21 1216 135/73     Pulse Rate 11/18/21 1216 79     Resp 11/18/21 1216 14     Temp 11/18/21 1216 97.7 F (36.5 C)     Temp Source 11/18/21 1216  Oral     SpO2 11/18/21 1216 100 %     Weight 11/18/21 1213 111 lb 15.9 oz (50.8 kg)     Height 11/18/21 1213 5\' 5"  (1.651 m)     Head Circumference --      Peak Flow --      Pain Score 11/18/21 1213 0     Pain Loc --      Pain Edu? --      Excl. in GC? --    No data found.  Updated Vital Signs BP 135/73 (BP Location: Left Arm)   Pulse 79   Temp 97.7 F (36.5 C) (Oral)   Resp 14   Ht 5\' 5"  (1.651 m)   Wt 111 lb 15.9 oz (50.8 kg)   SpO2 100%   BMI 18.64 kg/m   Visual Acuity Right Eye Distance:   Left Eye Distance:   Bilateral Distance:    Right Eye Near:   Left Eye Near:    Bilateral Near:     Physical Exam Vitals and nursing note reviewed.  Constitutional:      General: She is awake. She is not in acute distress.    Appearance: She is well-developed and well-groomed.     Comments: She is sitting in the exam chair in no acute distress but appears uncomfortable due to itchiness of bites.   HENT:     Head: Normocephalic and atraumatic.     Right Ear: Hearing normal.     Left Ear: Hearing normal.  Cardiovascular:     Rate and Rhythm: Normal rate.  Pulmonary:     Effort: Pulmonary effort is normal.  Musculoskeletal:        General: Normal range of motion.     Cervical back: Normal range of motion.  Skin:    General: Skin is warm.     Capillary Refill: Capillary refill takes less than 2 seconds.     Findings: Erythema, rash and wound (multiple insect bites) present. No abrasion, abscess, bruising, ecchymosis or petechiae. Rash is papular and pustular. Rash is not crusting, nodular, purpuric, scaling or vesicular.          Comments: Multiple red, raised areas on her lower legs, lower arms and hands bilaterally. Some lesions on her abdomen. Most lesions are papular with some surrounding erythema with a few lesions behind her left knee and on her left hand/fingers that have white to yellowish raised centers. No tenderness. No crusting. No lesions on her neck, upper back  or face.   Neurological:     General: No focal deficit present.     Mental Status: She is alert and oriented to person, place, and time.     Sensory: Sensation is intact. No sensory deficit.     Motor:  Motor function is intact.  Psychiatric:        Mood and Affect: Mood normal.        Behavior: Behavior normal. Behavior is cooperative.        Thought Content: Thought content normal.        Judgment: Judgment normal.      UC Treatments / Results  Labs (all labs ordered are listed, but only abnormal results are displayed) Labs Reviewed - No data to display  EKG   Radiology No results found.  Procedures Procedures (including critical care time)  Medications Ordered in UC Medications - No data to display  Initial Impression / Assessment and Plan / UC Course  I have reviewed the triage vital signs and the nursing notes.  Pertinent labs & imaging results that were available during my care of the patient were reviewed by me and considered in my medical decision making (see chart for details).     Reviewed with patient that she has multiple ant bites and she is experiencing localized reactions in multiple areas of her body. No anaphylactic reaction. Discussed that she should not open up the lesions that have white to yellow centers at this time since this action can increase chance of infection. Since bite areas/rash is fairly extensive, will treat with oral steroids. May take Prednisone 10mg  6 day dose pack as directed. May apply Caladryl lotion to areas for comfort. May also soak in a cool bath or apply cool compresses to area for comfort. Avoid heat which will increase itchiness. May also take oral Benadryl 25mg  every 6 hours as needed for itching. May also continue to apply Triamcinolone cream to areas that are most red and irritating twice a day. Follow-up in 3 to 4 days if minimal improvement or sooner if worsening.  Final Clinical Impressions(s) / UC Diagnoses   Final  diagnoses:  Fire ant bite, accidental or unintentional, initial encounter     Discharge Instructions      Recommend start oral Prednisone 10mg  tablets- take 6 tablets today with food then decrease by 1 tablet each day until finished. May use OTC Caladryl lotion and apply on bite areas. Apply cool compresses to area for comfort. May also take oral Benadryl 25mg  every 6 hours as needed for itching. Follow-up in 3 to 4 days if not improving and resolving or sooner if worsening.     ED Prescriptions     Medication Sig Dispense Auth. Provider   predniSONE (STERAPRED UNI-PAK 21 TAB) 10 MG (21) TBPK tablet Take by mouth daily. Take 6 tabs by mouth 1st day then decrease by 1 tablet each day until finished. 21 tablet Kalijah Zeiss, , NP      PDMP not reviewed this encounter.   , NP 11/19/21 1011

## 2021-11-18 NOTE — ED Triage Notes (Signed)
Pt c/o ant bite on her bilateral legs, arms. She states she stepped on a fire ant hill yesterday. She states the bites are extremely itchy.

## 2022-03-01 IMAGING — MG MM DIGITAL SCREENING BILAT W/ TOMO AND CAD
6 of 10 series · 6 of 30 positions shown · non-contrast
Comparison: Previous exam(s).

CLINICAL DATA: Screening.

EXAM:
DIGITAL SCREENING BILATERAL MAMMOGRAM WITH TOMOSYNTHESIS AND CAD
TECHNIQUE: Bilateral screening digital craniocaudal and mediolateral oblique
mammograms were obtained. Bilateral screening digital breast
tomosynthesis was performed. The images were evaluated with
computer-aided detection.

[L CC synth-2D]
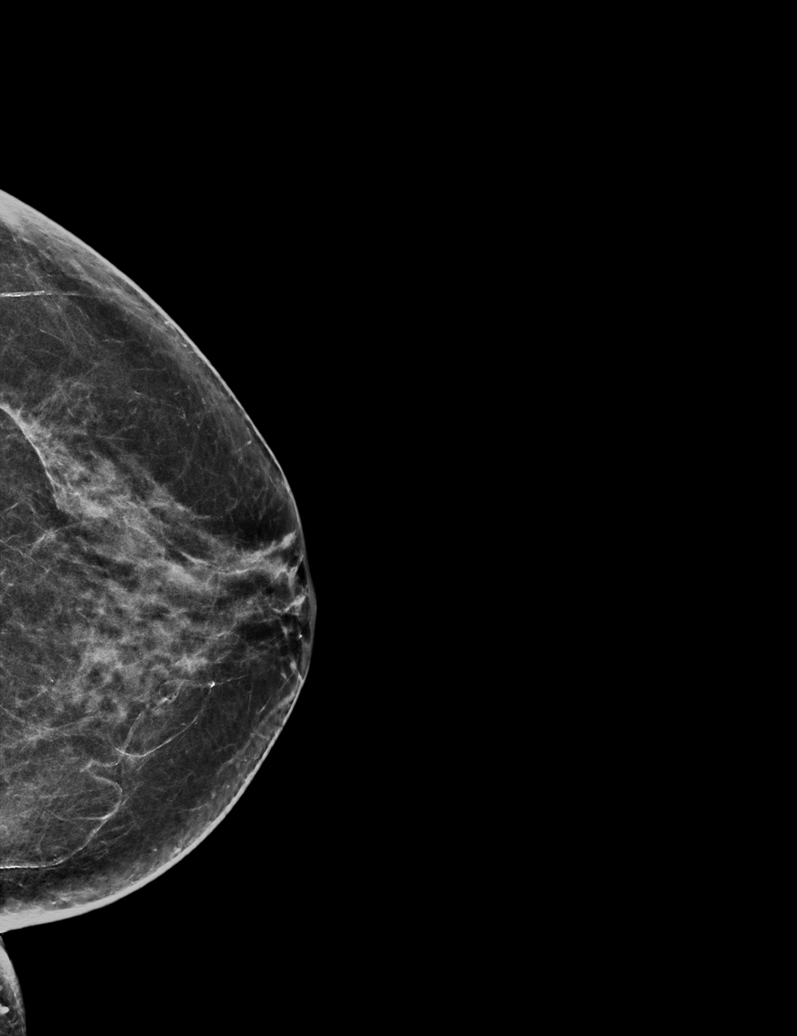

[L MLO synth-2D]
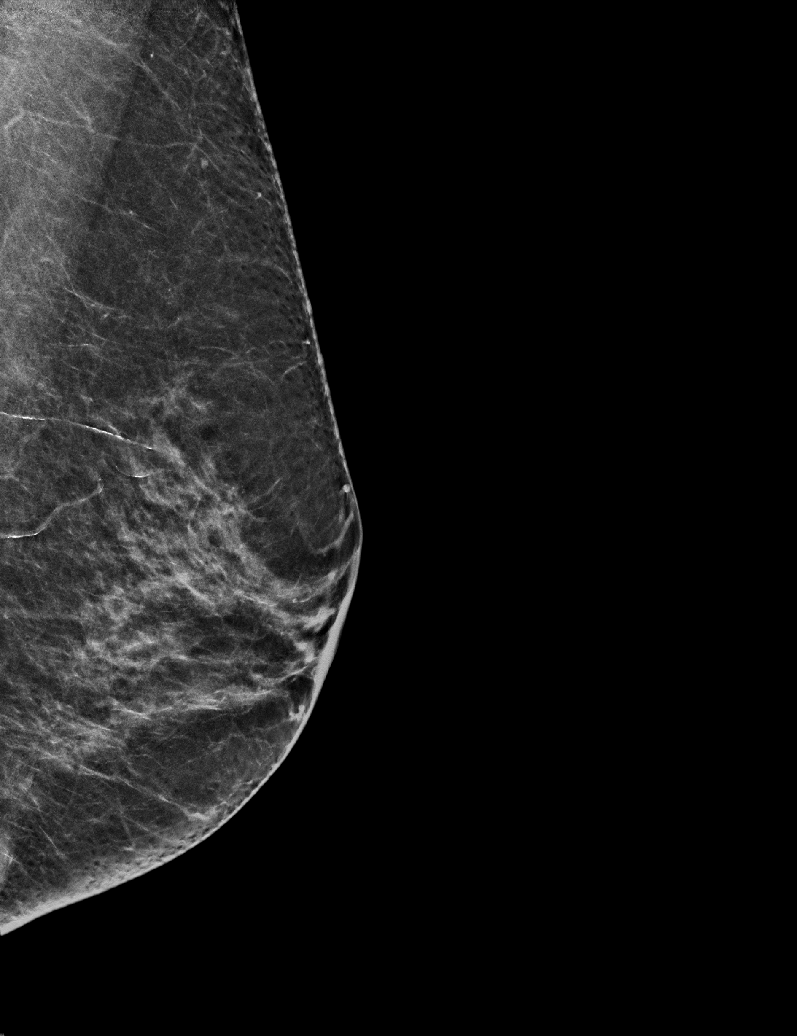

[R MLO synth-2D]
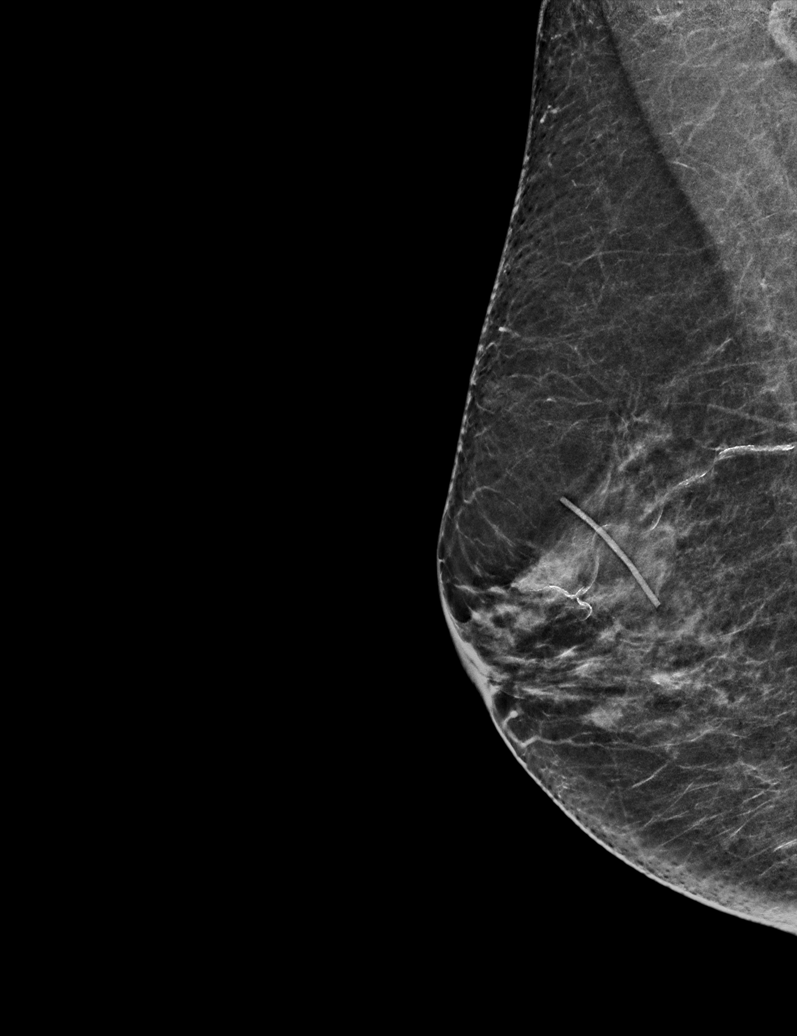

[R XCCL synth-2D]
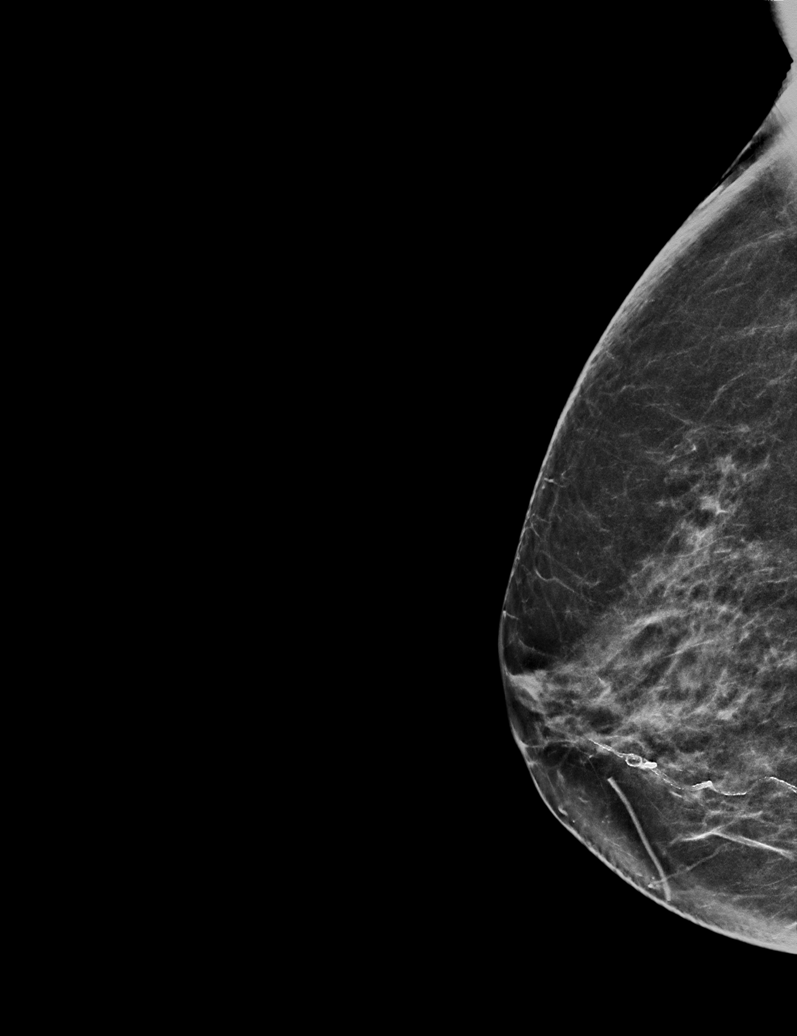

[R CC synth-2D]
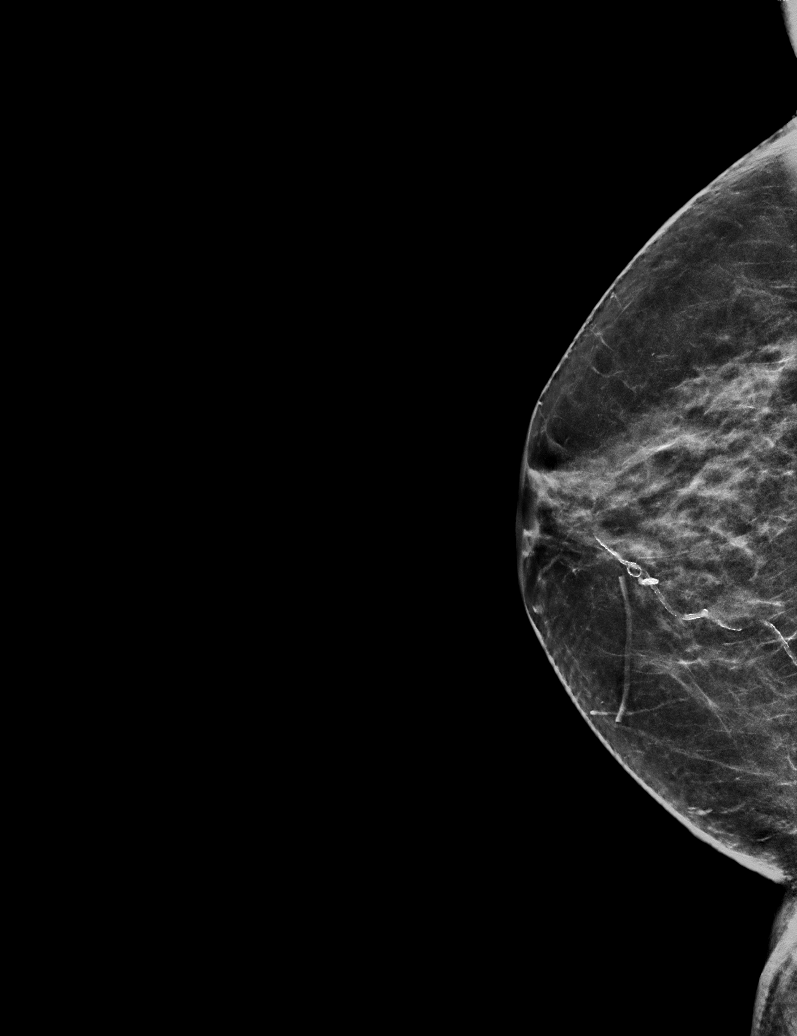

[R CC tomo · tomo slice 29/58.0]
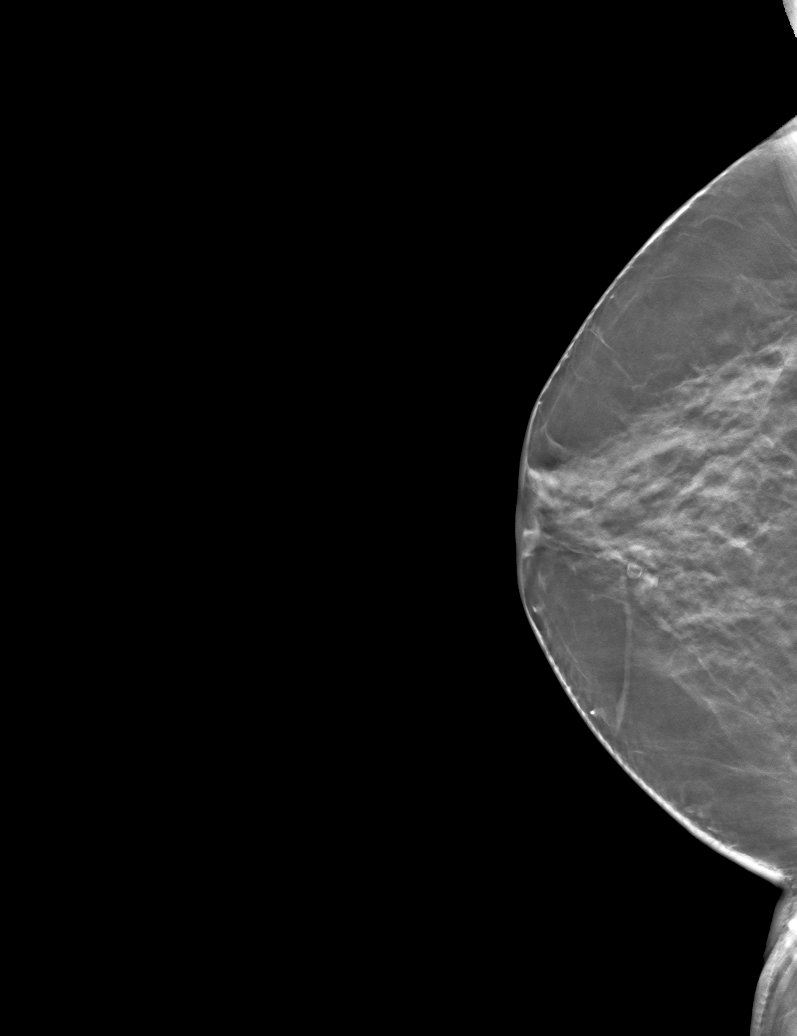

[6 of 30 positions shown; findings below may reference images not displayed]

ACR Breast Density Category c: The breast tissue is heterogeneously
dense, which may obscure small masses.
FINDINGS: There are no findings suspicious for malignancy.
IMPRESSION: No mammographic evidence of malignancy. A result letter of this
screening mammogram will be mailed directly to the patient.

RECOMMENDATION:
Screening mammogram in one year. (Code:Q3-W-BC3)

BI-RADS CATEGORY  1: Negative.

## 2022-07-03 ENCOUNTER — Other Ambulatory Visit: Payer: Self-pay

## 2022-07-03 DIAGNOSIS — Z1231 Encounter for screening mammogram for malignant neoplasm of breast: Secondary | ICD-10-CM

## 2022-07-08 ENCOUNTER — Ambulatory Visit
Admission: RE | Admit: 2022-07-08 | Discharge: 2022-07-08 | Disposition: A | Discharge status: Home / Self Care | Payer: Medicare Other | Source: Ambulatory Visit | Attending: Family Medicine | Admitting: Family Medicine

## 2022-07-08 DIAGNOSIS — Z1231 Encounter for screening mammogram for malignant neoplasm of breast: Secondary | ICD-10-CM | POA: Diagnosis present

## 2022-07-12 ENCOUNTER — Ambulatory Visit
Admission: RE | Admit: 2022-07-12 | Discharge: 2022-07-12 | Disposition: A | Discharge status: Home / Self Care | Payer: Medicare Other | Source: Ambulatory Visit | Attending: Family Medicine | Admitting: Family Medicine

## 2022-07-12 ENCOUNTER — Other Ambulatory Visit: Payer: Self-pay | Admitting: Family Medicine

## 2022-07-12 DIAGNOSIS — R928 Other abnormal and inconclusive findings on diagnostic imaging of breast: Secondary | ICD-10-CM | POA: Diagnosis present

## 2022-07-12 DIAGNOSIS — R921 Mammographic calcification found on diagnostic imaging of breast: Secondary | ICD-10-CM | POA: Diagnosis present

## 2022-09-02 ENCOUNTER — Encounter (INDEPENDENT_AMBULATORY_CARE_PROVIDER_SITE_OTHER): Payer: No Typology Code available for payment source | Admitting: Vascular Surgery

## 2022-09-02 ENCOUNTER — Encounter (INDEPENDENT_AMBULATORY_CARE_PROVIDER_SITE_OTHER): Payer: Medicare Other

## 2022-09-03 ENCOUNTER — Other Ambulatory Visit (INDEPENDENT_AMBULATORY_CARE_PROVIDER_SITE_OTHER): Payer: Self-pay | Admitting: Nurse Practitioner

## 2022-09-03 DIAGNOSIS — I83811 Varicose veins of right lower extremities with pain: Secondary | ICD-10-CM

## 2022-09-04 NOTE — Progress Notes (Signed)
MRN : [id number]  Kelly Rodgers is a 82 y.o. (Jan 05, 1941) female who presents with chief complaint of varicose veins hurt.  History of Present Illness: ***  No outpatient medications have been marked as taking for the 09/05/22 encounter (Appointment) with Gilda Crease, Latina Craver, MD.    Past Medical History:  Diagnosis Date   Hyperlipidemia    Osteomyelitis (HCC)    Peripheral vascular disease (HCC)     Past Surgical History:  Procedure Laterality Date   BONE RESECTION     Reclast infusion   BREAST BIOPSY Right 1972   neg    Social History Social History   Tobacco Use   Smoking status: Never   Smokeless tobacco: Never  Vaping Use   Vaping Use: Never used  Substance Use Topics   Alcohol use: No   Drug use: No    Family History Family History  Problem Relation Age of Onset   Diabetes Mother    Heart disease Mother    Varicose Veins Mother    Cancer Father    Heart disease Father    Heart disease Sister    Depression Sister    Heart disease Brother    Depression Brother    Diabetes Maternal Grandmother    Breast cancer Neg Hx     No Known Allergies   REVIEW OF SYSTEMS (Negative unless checked)  Constitutional: [] Weight loss  [] Fever  [] Chills Cardiac: [] Chest pain   [] Chest pressure   [] Palpitations   [] Shortness of breath when laying flat   [] Shortness of breath with exertion. Vascular:  [] Pain in legs with walking   [x] Pain in legs with standing  [] History of DVT   [] Phlebitis   [] Swelling in legs   [x] Varicose veins   [] Non-healing ulcers Pulmonary:   [] Uses home oxygen   [] Productive cough   [] Hemoptysis   [] Wheeze  [] COPD   [] Asthma Neurologic:  [] Dizziness   [] Seizures   [] History of stroke   [] History of TIA  [] Aphasia   [] Vissual changes   [] Weakness or numbness in arm   [] Weakness or numbness in leg Musculoskeletal:   [] Joint swelling   [] Joint pain   [] Low back pain Hematologic:  [] Easy bruising  [] Easy bleeding   [] Hypercoagulable state    [] Anemic Gastrointestinal:  [] Diarrhea   [] Vomiting  [] Gastroesophageal reflux/heartburn   [] Difficulty swallowing. Genitourinary:  [] Chronic kidney disease   [] Difficult urination  [] Frequent urination   [] Blood in urine Skin:  [] Rashes   [] Ulcers  Psychological:  [] History of anxiety   []  History of major depression.  Physical Examination  There were no vitals filed for this visit. There is no height or weight on file to calculate BMI. Gen: WD/WN, NAD Head: [place]/AT, No temporalis wasting.  Ear/Nose/Throat: Hearing grossly intact, nares w/o erythema or drainage, pinna without lesions Eyes: PER, EOMI, sclera nonicteric.  Neck: Supple, no gross masses.  No JVD.  Pulmonary:  Good air movement, no audible wheezing, no use of accessory muscles.  Cardiac: RRR, precordium not hyperdynamic. Vascular:  Large varicosities present, greater than 10 mm ***.  Veins are tender to palpation  Mild venous stasis changes to the legs bilaterally.  Trace soft pitting edema CEAP [id number] Vessel Right Left  Radial Palpable Palpable  Gastrointestinal: soft, non-distended. No guarding/no peritoneal signs.  Musculoskeletal: M/S 5/5 throughout.  No deformity.  Neurologic: CN 2-12 intact. Pain and light touch intact in extremities.  Symmetrical.  Speech is fluent. Motor exam as listed above. Psychiatric: Judgment intact,  Mood & affect appropriate for pt's clinical situation. Dermatologic: Venous rashes no ulcers noted.  No changes consistent with cellulitis. Lymph : No lichenification or skin changes of chronic lymphedema.  CBC No results found for: "WBC", "HGB", "HCT", "MCV", "PLT"  BMET No results found for: "NA", "K", "CL", "CO2", "GLUCOSE", "BUN", "CREATININE", "CALCIUM", "GFRNONAA", "GFRAA" CrCl cannot be calculated (No successful lab value found.).  COAG No results found for: "INR", "PROTIME"  Radiology No results found.   Assessment/Plan There are no diagnoses linked to this  encounter.   Levora Dredge, MD  09/04/2022 8:54 PM

## 2022-09-05 ENCOUNTER — Encounter (INDEPENDENT_AMBULATORY_CARE_PROVIDER_SITE_OTHER): Payer: Self-pay | Admitting: Vascular Surgery

## 2022-09-05 ENCOUNTER — Ambulatory Visit (INDEPENDENT_AMBULATORY_CARE_PROVIDER_SITE_OTHER): Payer: No Typology Code available for payment source | Admitting: Vascular Surgery

## 2022-09-05 ENCOUNTER — Ambulatory Visit (INDEPENDENT_AMBULATORY_CARE_PROVIDER_SITE_OTHER): Payer: Medicare Other

## 2022-09-05 VITALS — BP 134/73 | HR 86 | Resp 16 | Ht 64.0 in | Wt 115.0 lb

## 2022-09-05 DIAGNOSIS — I872 Venous insufficiency (chronic) (peripheral): Secondary | ICD-10-CM

## 2022-09-05 DIAGNOSIS — I83811 Varicose veins of right lower extremities with pain: Secondary | ICD-10-CM

## 2022-10-09 ENCOUNTER — Telehealth (INDEPENDENT_AMBULATORY_CARE_PROVIDER_SITE_OTHER): Payer: Self-pay | Admitting: Nurse Practitioner

## 2022-10-09 NOTE — Telephone Encounter (Signed)
LVM for pt TCB and schedule SALINE sclero appt with FB  right leg SALINE sclero - see fb. no prior auth req (Medicare)  X 3

## 2022-11-05 ENCOUNTER — Encounter (INDEPENDENT_AMBULATORY_CARE_PROVIDER_SITE_OTHER): Payer: Self-pay | Admitting: Nurse Practitioner

## 2022-11-05 ENCOUNTER — Ambulatory Visit (INDEPENDENT_AMBULATORY_CARE_PROVIDER_SITE_OTHER): Payer: Medicare Other | Admitting: Nurse Practitioner

## 2022-11-05 VITALS — BP 119/70 | HR 94 | Resp 16 | Wt 117.6 lb

## 2022-11-05 DIAGNOSIS — I83811 Varicose veins of right lower extremities with pain: Secondary | ICD-10-CM

## 2022-11-05 NOTE — Progress Notes (Signed)
Varicose veins of right  lower extremity with inflammation (454.1  I83.10) Current Plans   Indication: Patient presents with symptomatic varicose veins of the right  lower extremity.   Procedure: Sclerotherapy using hypertonic saline mixed with 1% Lidocaine was performed on the right lower extremity. Compression wraps were placed. The patient tolerated the procedure well. 

## 2022-11-20 ENCOUNTER — Ambulatory Visit
Admission: EM | Admit: 2022-11-20 | Discharge: 2022-11-20 | Disposition: A | Discharge status: Home / Self Care | Payer: Medicare Other | Attending: Emergency Medicine | Admitting: Emergency Medicine

## 2022-11-20 DIAGNOSIS — S80861A Insect bite (nonvenomous), right lower leg, initial encounter: Secondary | ICD-10-CM | POA: Diagnosis not present

## 2022-11-20 DIAGNOSIS — T63421A Toxic effect of venom of ants, accidental (unintentional), initial encounter: Secondary | ICD-10-CM

## 2022-11-20 DIAGNOSIS — S80862A Insect bite (nonvenomous), left lower leg, initial encounter: Secondary | ICD-10-CM | POA: Diagnosis not present

## 2022-11-20 DIAGNOSIS — W57XXXA Bitten or stung by nonvenomous insect and other nonvenomous arthropods, initial encounter: Secondary | ICD-10-CM

## 2022-11-20 MED ORDER — FLUOCINONIDE 0.05 % EX OINT: 1.0000 | TOPICAL_OINTMENT | Freq: Two times a day (BID) | CUTANEOUS | 0 refills | Status: DC

## 2022-11-20 MED ORDER — PREDNISONE 20 MG PO TABS: 40.0000 mg | ORAL_TABLET | Freq: Every day | ORAL | 0 refills | Status: AC

## 2022-11-20 MED ORDER — MUPIROCIN 2 % EX OINT: 1.0000 | TOPICAL_OINTMENT | Freq: Two times a day (BID) | CUTANEOUS | 0 refills | Status: DC

## 2022-11-20 NOTE — ED Triage Notes (Signed)
Pt c/o insect bites in bilateral legs x1 day. States she was mowing & was bit by fire ants. Has tried benadryl cream & spray w/o relief.

## 2022-11-20 NOTE — Discharge Instructions (Signed)
Try the fluocinonide and Zyrtec first.  Stop Benadryl.  If the topical steroid does not help, and then start prednisone for 5 days.  Keep clean with soap and water.  Bactroban to help prevent infection.

## 2022-11-20 NOTE — ED Provider Notes (Signed)
HPI  SUBJECTIVE:  Kelly Rodgers is a 82 y.o. female who presents with multiple intensely pruritic, burning pustules surrounded by erythema on her bilateral lower extremities.  States she even has a lesion or 2 on her upper left thigh.  She states that she was weed whacking where there are known fire ants yesterday.  Suspects these are fire ant bites.  She was unable to sleep because of the itching.  No fevers, body aches.  She tried oral and topical Benadryl, rubbing alcohol, another anti-itch spray and triamcinolone cream without improvement in her symptoms.  No aggravating factors.  She has a past medical history of osteoporosis.  No history of MRSA.  PCP: [facility].    Past Medical History:  Diagnosis Date   Hyperlipidemia    Osteomyelitis (HCC)    Peripheral vascular disease (HCC)     Past Surgical History:  Procedure Laterality Date   BONE RESECTION     Reclast infusion   BREAST BIOPSY Right 1972   neg    Family History  Problem Relation Age of Onset   Diabetes Mother    Heart disease Mother    Varicose Veins Mother    Cancer Father    Heart disease Father    Heart disease Sister    Depression Sister    Heart disease Brother    Depression Brother    Diabetes Maternal Grandmother    Breast cancer Neg Hx     Social History   Tobacco Use   Smoking status: Never   Smokeless tobacco: Never  Vaping Use   Vaping status: Never Used  Substance Use Topics   Alcohol use: No   Drug use: No    No current facility-administered medications for this encounter.  Current Outpatient Medications:    Calcium Carbonate-Vitamin D (CALCIUM-VITAMIN D) 500-200 MG-UNIT tablet, Take by mouth., Disp: , Rfl:    carboxymethylcellulose (REFRESH PLUS) 0.5 % SOLN, 1 drop 3 (three) times daily as needed., Disp: , Rfl:    clonazePAM (KLONOPIN) 0.5 MG tablet, , Disp: , Rfl:    estradiol (ESTRACE) 0.1 MG/GM vaginal cream, Place vaginally., Disp: , Rfl:    fluocinonide ointment  (LIDEX) 0.05 %, Apply 1 Application topically 2 (two) times daily., Disp: 30 g, Rfl: 0   gabapentin (NEURONTIN) 600 MG tablet, Take 600 mg by mouth at bedtime as needed., Disp: , Rfl:    Multiple Vitamins-Minerals (PRESERVISION AREDS 2 PO), Take by mouth., Disp: , Rfl:    mupirocin ointment (BACTROBAN) 2 %, Apply 1 Application topically 2 (two) times daily., Disp: 22 g, Rfl: 0   predniSONE (DELTASONE) 20 MG tablet, Take 2 tablets (40 mg total) by mouth daily with breakfast for 5 days., Disp: 10 tablet, Rfl: 0   rosuvastatin (CRESTOR) 5 MG tablet, Take 5 mg by mouth once a week., Disp: , Rfl:   No Known Allergies   ROS  As noted in HPI.   Physical Exam  BP 131/81 (BP Location: Left Arm)   Pulse 89   Temp 97.9 F (36.6 C) (Oral)   Resp 16   Ht 5\' 4"  (1.626 m)   Wt 53.3 kg   SpO2 94%   BMI 20.19 kg/m   Constitutional: Well developed, well nourished, no acute distress Eyes:  EOMI, conjunctiva normal bilaterally HENT: Normocephalic, atraumatic,mucus membranes moist Respiratory: Normal inspiratory effort Cardiovascular: Normal rate GI: nondistended skin: Multiple nontender pustules with surrounding blanchable erythema bilateral lower extremities.  Some excoriations and crusting.  Musculoskeletal: no deformities Neurologic: Alert & oriented x 3, no focal neuro deficits Psychiatric: Speech and behavior appropriate   ED Course   Medications - No data to display  No orders of the defined types were placed in this encounter.   No results found for this or any previous visit (from the past 24 hour(s)). No results found.  ED Clinical Impression  1. Fire ant bite, accidental or unintentional, initial encounter   2. Multiple insect bites      ED Assessment/Plan     Patient presents with multiple fire ant bites.  She having local reactions, but has multiple bites not responding to topical steroids.  No evidence of systemic involvement.  Stop Benadryl.  Will  send home with fluocinonide 0.05% ointment twice daily  Zyrtec per up-to-date recommendations, and 20 mg of prednisone for 5 days if the fluocinonide and Zyrtec do  not work.  Bactroban in case this become secondarily infected.  No indications for oral antibiotics at this time.  Advised to keep this clean with soap and water.  Follow-up with PCP as needed.  Discussed  MDM, treatment plan, and plan for follow-up with patient.  patient agrees with plan.   Meds ordered this encounter  Medications   fluocinonide ointment (LIDEX) 0.05 %    Sig: Apply 1 Application topically 2 (two) times daily.    Dispense:  30 g    Refill:  0   predniSONE (DELTASONE) 20 MG tablet    Sig: Take 2 tablets (40 mg total) by mouth daily with breakfast for 5 days.    Dispense:  10 tablet    Refill:  0   mupirocin ointment (BACTROBAN) 2 %    Sig: Apply 1 Application topically 2 (two) times daily.    Dispense:  22 g    Refill:  0      *This clinic note was created using Scientist, clinical (histocompatibility and immunogenetics). Therefore, there may be occasional mistakes despite careful proofreading.  ?    Domenick Gong, MD 11/20/22 1300

## 2022-12-03 ENCOUNTER — Ambulatory Visit (INDEPENDENT_AMBULATORY_CARE_PROVIDER_SITE_OTHER): Payer: Medicare Other | Admitting: Nurse Practitioner

## 2022-12-03 ENCOUNTER — Encounter (INDEPENDENT_AMBULATORY_CARE_PROVIDER_SITE_OTHER): Payer: Self-pay | Admitting: Nurse Practitioner

## 2022-12-03 VITALS — BP 134/69 | HR 86 | Resp 18 | Ht 64.0 in | Wt 118.0 lb

## 2022-12-03 DIAGNOSIS — I83811 Varicose veins of right lower extremities with pain: Secondary | ICD-10-CM | POA: Diagnosis not present

## 2022-12-03 NOTE — Progress Notes (Signed)
Varicose veins of right  lower extremity with inflammation (454.1  I83.10) Current Plans   Indication: Patient presents with symptomatic varicose veins of the right  lower extremity.   Procedure: Sclerotherapy using hypertonic saline mixed with 1% Lidocaine was performed on the right lower extremity. Compression wraps were placed. The patient tolerated the procedure well. 

## 2022-12-31 ENCOUNTER — Encounter (INDEPENDENT_AMBULATORY_CARE_PROVIDER_SITE_OTHER): Payer: Self-pay | Admitting: Nurse Practitioner

## 2022-12-31 ENCOUNTER — Ambulatory Visit (INDEPENDENT_AMBULATORY_CARE_PROVIDER_SITE_OTHER): Payer: Medicare Other | Admitting: Nurse Practitioner

## 2022-12-31 VITALS — BP 125/76 | HR 94 | Resp 16 | Wt 117.6 lb

## 2022-12-31 DIAGNOSIS — I83811 Varicose veins of right lower extremities with pain: Secondary | ICD-10-CM | POA: Diagnosis not present

## 2022-12-31 NOTE — Progress Notes (Signed)
Varicose veins of right  lower extremity with inflammation (454.1  I83.10) Current Plans   Indication: Patient presents with symptomatic varicose veins of the right  lower extremity.   Procedure: Sclerotherapy using hypertonic saline mixed with 1% Lidocaine was performed on the right lower extremity. Compression wraps were placed. The patient tolerated the procedure well. 

## 2023-01-31 ENCOUNTER — Encounter (INDEPENDENT_AMBULATORY_CARE_PROVIDER_SITE_OTHER): Payer: Self-pay | Admitting: Nurse Practitioner

## 2023-01-31 ENCOUNTER — Ambulatory Visit (INDEPENDENT_AMBULATORY_CARE_PROVIDER_SITE_OTHER): Payer: Medicare Other | Admitting: Nurse Practitioner

## 2023-01-31 VITALS — BP 131/79 | HR 89 | Resp 16 | Wt 118.0 lb

## 2023-01-31 DIAGNOSIS — I83811 Varicose veins of right lower extremities with pain: Secondary | ICD-10-CM

## 2023-02-03 ENCOUNTER — Encounter (INDEPENDENT_AMBULATORY_CARE_PROVIDER_SITE_OTHER): Payer: Self-pay | Admitting: Nurse Practitioner

## 2023-02-03 NOTE — Progress Notes (Signed)
Varicose veins of right  lower extremity with inflammation (454.1  I83.10) Current Plans   Indication: Patient presents with symptomatic varicose veins of the right  lower extremity.   Procedure: Sclerotherapy using hypertonic saline mixed with 1% Lidocaine was performed on the right lower extremity. Compression wraps were placed. The patient tolerated the procedure well. 

## 2023-03-04 ENCOUNTER — Ambulatory Visit
Admission: EM | Admit: 2023-03-04 | Discharge: 2023-03-04 | Disposition: A | Discharge status: Home / Self Care | Payer: Medicare Other | Attending: Physician Assistant | Admitting: Physician Assistant

## 2023-03-04 DIAGNOSIS — N3000 Acute cystitis without hematuria: Secondary | ICD-10-CM | POA: Diagnosis not present

## 2023-03-04 DIAGNOSIS — R300 Dysuria: Secondary | ICD-10-CM

## 2023-03-04 LAB — URINALYSIS, W/ REFLEX TO CULTURE (INFECTION SUSPECTED)
Bilirubin Urine: NEGATIVE
Glucose, UA: NEGATIVE mg/dL
Hgb urine dipstick: NEGATIVE
Ketones, ur: NEGATIVE mg/dL
Nitrite: NEGATIVE
Protein, ur: NEGATIVE mg/dL
RBC / HPF: NONE SEEN RBC/hpf (ref 0–5)
Specific Gravity, Urine: >1.03 — ABNORMAL HIGH (ref 1.005–1.030)
pH: 5.5 (ref 5.0–8.0)

## 2023-03-04 MED ORDER — CIPROFLOXACIN HCL 250 MG PO TABS: 250.0000 mg | ORAL_TABLET | Freq: Two times a day (BID) | ORAL | 0 refills | Status: AC

## 2023-03-04 NOTE — ED Triage Notes (Signed)
Pt c/o urinary freq,burning & pain x1 wk. Denies any hematuria. Has tried AZO w/o relief.

## 2023-03-04 NOTE — ED Provider Notes (Signed)
[facility]    CSN: [id number] Arrival date & time: 03/04/23  1416      History   Chief Complaint Chief Complaint  Patient presents with   Dysuria    HPI Kelly Rodgers is a 82 y.o. female presenting for 1 week history of urinary frequency and dysuria. Denies fever, fatigue, hematuria, abdominal pain, n/v, flank pain.  Has been taking Pyridium which has helped.  Patient took 500 mg Keflex 3 times daily x 7 days last month for UTI.  Patient reports that symptoms resolved last month this medication.  However, she says she normally is given different medicine such as Macrodantin, Bactrim DS or Cipro.  She prefers 1 of those medications and request to take Cipro today if she is found to have a UTI.  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      Home Medications    Prior to Admission medications   Medication Sig Start Date End Date Taking? Authorizing Provider  Calcium Carbonate-Vitamin D (CALCIUM-VITAMIN D) 500-200 MG-UNIT tablet Take by mouth.   Yes [provider]  carboxymethylcellulose (REFRESH PLUS) 0.5 % SOLN 1 drop 3 (three) times daily as needed.   Yes [provider]  ciclopirox (PENLAC) 8 % solution Apply topically. 11/25/22  Yes [provider]  ciprofloxacin (CIPRO) 250 MG tablet Take 1 tablet (250 mg total) by mouth 2 (two) times daily for 7 days. 03/04/23 03/11/23 Yes Shirlee Latch, PA-C  clonazePAM (KLONOPIN) 0.5 MG tablet  02/01/16  Yes  [provider]  estradiol (ESTRACE) 0.1 MG/GM vaginal cream Place vaginally.   Yes [provider]  fluocinonide ointment (LIDEX) 0.05 % Apply 1 Application topically 2 (two) times daily. 11/20/22  Yes Domenick Gong, MD  gabapentin (NEURONTIN) 600 MG tablet Take 600 mg by mouth at bedtime as needed.   Yes [provider]  Multiple Vitamins-Minerals (PRESERVISION AREDS 2 PO) Take by mouth.   Yes [provider]  rosuvastatin (CRESTOR) 5 MG tablet Take 5 mg by mouth once a week. 04/11/21  Yes [provider]  mupirocin ointment (BACTROBAN) 2 % Apply 1 Application topically 2 (two) times daily. Patient not taking: Reported on 12/03/2022 11/20/22   Domenick Gong, MD    Family History Family History  Problem Relation Age of Onset   Diabetes Mother    Heart disease Mother    Varicose Veins Mother    Cancer Father    Heart disease Father    Heart disease Sister    Depression Sister    Heart disease Brother    Depression Brother    Diabetes Maternal Grandmother    Breast cancer Neg Hx     Social History Social History   Tobacco Use   Smoking status: Never   Smokeless tobacco: Never  Vaping Use   Vaping status: Never Used  Substance Use Topics   Alcohol use: No  Drug use: No     Allergies   Patient has no known allergies.   Review of Systems Review of Systems  Constitutional:  Negative for chills, fatigue and fever.  Gastrointestinal:  Negative for abdominal pain, diarrhea, nausea and vomiting.  Genitourinary:  Positive for dysuria, frequency and urgency. Negative for decreased urine volume, flank pain, hematuria, pelvic pain, vaginal bleeding, vaginal discharge and vaginal pain.  Musculoskeletal:  Negative for back pain.  Skin:  Negative for rash.     Physical Exam Triage Vital Signs ED Triage Vitals  Encounter Vitals Group     BP      Systolic BP Percentile      Diastolic BP Percentile      Pulse      Resp       Temp      Temp src      SpO2      Weight      Height      Head Circumference      Peak Flow      Pain Score      Pain Loc      Pain Education      Exclude from Growth Chart    No data found.  Updated Vital Signs BP (!) 146/79 (BP Location: Left Arm)   Pulse 95   Temp 98.3 F (36.8 C) (Oral)   Resp 16   Ht 5\' 4"  (1.626 m)   Wt 113 lb (51.3 kg)   SpO2 95%   BMI 19.40 kg/m   Physical Exam Vitals and nursing note reviewed.  Constitutional:      General: She is not in acute distress.    Appearance: Normal appearance. She is not ill-appearing or toxic-appearing.  HENT:     Head: Normocephalic and atraumatic.  Eyes:     General: No scleral icterus.       Right eye: No discharge.        Left eye: No discharge.     Conjunctiva/sclera: Conjunctivae normal.  Cardiovascular:     Rate and Rhythm: Normal rate and regular rhythm.     Heart sounds: Normal heart sounds.  Pulmonary:     Effort: Pulmonary effort is normal. No respiratory distress.     Breath sounds: Normal breath sounds.  Abdominal:     Palpations: Abdomen is soft.     Tenderness: There is no abdominal tenderness. There is no right CVA tenderness or left CVA tenderness.  Musculoskeletal:     Cervical back: Neck supple.  Skin:    General: Skin is dry.  Neurological:     General: No focal deficit present.     Mental Status: She is alert. Mental status is at baseline.     Motor: No weakness.     Gait: Gait normal.  Psychiatric:        Mood and Affect: Mood normal.        Behavior: Behavior normal.        Thought Content: Thought content normal.      UC Treatments / Results  Labs (all labs ordered are listed, but only abnormal results are displayed) Labs Reviewed  URINALYSIS, W/ REFLEX TO CULTURE (INFECTION SUSPECTED) - Abnormal; Notable for the following components:      Result Value   Specific Gravity, Urine >1.030 (*)    Leukocytes,Ua TRACE (*)    Bacteria, UA MANY (*)    All other components  within normal limits  URINE CULTURE    EKG  Radiology No results found.  Procedures Procedures (including critical care time)  Medications Ordered in UC Medications - No data to display  Initial Impression / Assessment and Plan / UC Course  I have reviewed the triage vital signs and the nursing notes.  Pertinent labs & imaging results that were available during my care of the patient were reviewed by me and considered in my medical decision making (see chart for details).   82 y/o female presents for 1 week history of dysuria and urinary frequency. No fever, hematuria or vaginal complaints. Treated for UTI last month with Keflex. No culture done to review.  She is afebrile. NAD. Overall well appearing. Abdomen is soft and non tender. No CVA tenderness.  UA obtained.  UA shows trace leukocytes and many bacteria.  Urine sent for culture.  Will treat for suspected urinary tract infection especially given her history of recurrent UTIs.  Patient request Cipro.  Sent Cipro to pharmacy.  She has done well with this medication in the past.  Will amend treatment based on culture results if necessary.  Encouraged increasing rest and fluids and supportive care discussed.  Reviewed return as needed.   Final Clinical Impressions(s) / UC Diagnoses   Final diagnoses:  Acute cystitis without hematuria  Dysuria     Discharge Instructions      UTI: Based on either symptoms or urinalysis, you may have a urinary tract infection. We will send the urine for culture and call with results in a few days. Begin antibiotics at this time. Your symptoms should be much improved over the next 2-3 days. Increase rest and fluid intake. If for some reason symptoms are worsening or not improving after a couple of days or the urine culture determines the antibiotics you are taking will not treat the infection, the antibiotics may be changed. Return or go to ER for fever, back pain, worsening urinary pain,  discharge, increased blood in urine. May take Tylenol or Motrin OTC for pain relief or consider AZO if no contraindications      ED Prescriptions     Medication Sig Dispense Auth. Provider   ciprofloxacin (CIPRO) 250 MG tablet Take 1 tablet (250 mg total) by mouth 2 (two) times daily for 7 days. 14 tablet Gareth Morgan      PDMP not reviewed this encounter.   Shirlee Latch, PA-C 03/04/23 1513

## 2023-03-04 NOTE — Discharge Instructions (Signed)

## 2023-03-06 LAB — URINE CULTURE: Culture: >=100000 — AB

## 2023-07-23 ENCOUNTER — Other Ambulatory Visit: Payer: Self-pay | Admitting: Family Medicine

## 2023-07-23 DIAGNOSIS — Z1231 Encounter for screening mammogram for malignant neoplasm of breast: Secondary | ICD-10-CM

## 2023-07-29 ENCOUNTER — Encounter: Payer: Self-pay | Admitting: Urology

## 2023-07-29 DIAGNOSIS — N1330 Unspecified hydronephrosis: Secondary | ICD-10-CM

## 2023-07-29 DIAGNOSIS — N390 Urinary tract infection, site not specified: Secondary | ICD-10-CM

## 2023-08-08 ENCOUNTER — Other Ambulatory Visit: Payer: Self-pay | Admitting: Urology

## 2023-08-08 DIAGNOSIS — N390 Urinary tract infection, site not specified: Secondary | ICD-10-CM

## 2023-08-08 DIAGNOSIS — N1330 Unspecified hydronephrosis: Secondary | ICD-10-CM

## 2023-08-19 ENCOUNTER — Ambulatory Visit

## 2023-08-27 ENCOUNTER — Ambulatory Visit
Admission: RE | Admit: 2023-08-27 | Discharge: 2023-08-27 | Disposition: A | Discharge status: Home / Self Care | Source: Ambulatory Visit | Attending: Family Medicine | Admitting: Family Medicine

## 2023-08-27 DIAGNOSIS — N390 Urinary tract infection, site not specified: Secondary | ICD-10-CM | POA: Insufficient documentation

## 2023-08-27 DIAGNOSIS — Z1231 Encounter for screening mammogram for malignant neoplasm of breast: Secondary | ICD-10-CM | POA: Diagnosis present

## 2023-08-27 DIAGNOSIS — N1330 Unspecified hydronephrosis: Secondary | ICD-10-CM | POA: Diagnosis present

## 2023-08-28 ENCOUNTER — Ambulatory Visit
Admission: RE | Admit: 2023-08-28 | Discharge: 2023-08-28 | Disposition: A | Discharge status: Home / Self Care | Source: Ambulatory Visit | Attending: Urology | Admitting: Urology

## 2023-08-28 DIAGNOSIS — N1330 Unspecified hydronephrosis: Secondary | ICD-10-CM

## 2023-08-28 DIAGNOSIS — N390 Urinary tract infection, site not specified: Secondary | ICD-10-CM

## 2023-09-16 ENCOUNTER — Encounter (INDEPENDENT_AMBULATORY_CARE_PROVIDER_SITE_OTHER): Payer: Self-pay

## 2024-06-02 NOTE — Discharge Instructions (Signed)

## 2024-06-03 ENCOUNTER — Encounter: Payer: Self-pay | Admitting: Ophthalmology

## 2024-06-07 ENCOUNTER — Ambulatory Visit: Admission: RE | Admit: 2024-06-07 | Admitting: Ophthalmology

## 2024-06-07 ENCOUNTER — Encounter: Admission: RE | Payer: Self-pay | Source: Home / Self Care

## 2024-06-07 HISTORY — DX: Prediabetes: R73.03

## 2024-06-07 HISTORY — DX: Family history of other specified conditions: Z84.89

## 2024-06-21 ENCOUNTER — Ambulatory Visit: Admit: 2024-06-21 | Admitting: Ophthalmology
